# Patient Record
Sex: Female | Born: 1980 | Race: White | Hispanic: No | Marital: Married | Smoking: Never smoker
Health system: Southern US, Community
[De-identification: ages and names within clinical notes are randomized; demographics above are authoritative.]

## PROBLEM LIST (undated history)

## (undated) DIAGNOSIS — Z8759 Personal history of other complications of pregnancy, childbirth and the puerperium: Secondary | ICD-10-CM

## (undated) DIAGNOSIS — Z8619 Personal history of other infectious and parasitic diseases: Secondary | ICD-10-CM

## (undated) DIAGNOSIS — E7212 Methylenetetrahydrofolate reductase deficiency: Secondary | ICD-10-CM

## (undated) DIAGNOSIS — E039 Hypothyroidism, unspecified: Secondary | ICD-10-CM

## (undated) DIAGNOSIS — Z1589 Genetic susceptibility to other disease: Secondary | ICD-10-CM

## (undated) HISTORY — DX: Methylenetetrahydrofolate reductase deficiency: E72.12

## (undated) HISTORY — DX: Genetic susceptibility to other disease: Z15.89

## (undated) HISTORY — PX: TONSILLECTOMY: SUR1361

## (undated) HISTORY — DX: Hypothyroidism, unspecified: E03.9

## (undated) HISTORY — DX: Personal history of other complications of pregnancy, childbirth and the puerperium: Z87.59

## (undated) HISTORY — DX: Personal history of other infectious and parasitic diseases: Z86.19

---

## 2002-07-11 ENCOUNTER — Emergency Department (HOSPITAL_COMMUNITY): Admission: EM | Admit: 2002-07-11 | Discharge: 2002-07-11 | Payer: Self-pay | Admitting: Emergency Medicine

## 2013-03-17 ENCOUNTER — Inpatient Hospital Stay (HOSPITAL_COMMUNITY): Payer: BC Managed Care – PPO

## 2013-03-17 ENCOUNTER — Encounter (HOSPITAL_COMMUNITY): Payer: Self-pay

## 2013-03-17 ENCOUNTER — Inpatient Hospital Stay (HOSPITAL_COMMUNITY)
Admission: AD | Admit: 2013-03-17 | Discharge: 2013-03-17 | Disposition: A | Discharge status: Home / Self Care | Payer: BC Managed Care – PPO | Source: Ambulatory Visit | Attending: Obstetrics and Gynecology | Admitting: Obstetrics and Gynecology

## 2013-03-17 DIAGNOSIS — O00109 Unspecified tubal pregnancy without intrauterine pregnancy: Secondary | ICD-10-CM | POA: Diagnosis not present

## 2013-03-17 DIAGNOSIS — N9489 Other specified conditions associated with female genital organs and menstrual cycle: Secondary | ICD-10-CM | POA: Diagnosis not present

## 2013-03-17 DIAGNOSIS — N72 Inflammatory disease of cervix uteri: Secondary | ICD-10-CM | POA: Diagnosis not present

## 2013-03-17 DIAGNOSIS — R109 Unspecified abdominal pain: Secondary | ICD-10-CM | POA: Insufficient documentation

## 2013-03-17 DIAGNOSIS — N83209 Unspecified ovarian cyst, unspecified side: Secondary | ICD-10-CM | POA: Diagnosis not present

## 2013-03-17 LAB — COMPREHENSIVE METABOLIC PANEL
ALBUMIN: 4.1 g/dL (ref 3.5–5.2)
ALT: 14 U/L (ref 0–35)
AST: 18 U/L (ref 0–37)
Alkaline Phosphatase: 56 U/L (ref 39–117)
BUN: 7 mg/dL (ref 6–23)
CHLORIDE: 102 meq/L (ref 96–112)
CO2: 26 mEq/L (ref 19–32)
CREATININE: 0.74 mg/dL (ref 0.50–1.10)
Calcium: 9.5 mg/dL (ref 8.4–10.5)
GFR calc Af Amer: >90 mL/min (ref >90)
GFR calc non Af Amer: >90 mL/min (ref >90)
Glucose, Bld: 92 mg/dL (ref 70–99)
Potassium: 3.7 mEq/L (ref 3.7–5.3)
Sodium: 140 mEq/L (ref 137–147)
Total Bilirubin: 0.5 mg/dL (ref 0.3–1.2)
Total Protein: 6.7 g/dL (ref 6.0–8.3)

## 2013-03-17 LAB — CBC
HEMATOCRIT: 39 % (ref 36.0–46.0)
Hemoglobin: 13.2 g/dL (ref 12.0–15.0)
MCH: 29.2 pg (ref 26.0–34.0)
MCHC: 33.8 g/dL (ref 30.0–36.0)
MCV: 86.3 fL (ref 78.0–100.0)
Platelets: 231 10*3/uL (ref 150–400)
RBC: 4.52 MIL/uL (ref 3.87–5.11)
RDW: 13.1 % (ref 11.5–15.5)
WBC: 7.6 10*3/uL (ref 4.0–10.5)

## 2013-03-17 LAB — HCG, QUANTITATIVE, PREGNANCY: hCG, Beta Chain, Quant, S: 303 m[IU]/mL — ABNORMAL HIGH (ref <5)

## 2013-03-17 MED ORDER — METHOTREXATE INJECTION FOR WOMEN'S HOSPITAL
50.0000 mg/m2 | Freq: Once | INTRAMUSCULAR | Status: AC
  Administered 2013-03-17: 95 mg via INTRAMUSCULAR
  Filled 2013-03-17: qty 1.9

## 2013-03-17 NOTE — Discharge Instructions (Signed)
Ectopic Pregnancy °An ectopic pregnancy is when the fertilized egg attaches (implants) outside the uterus. Most ectopic pregnancies occur in the fallopian tube. Rarely do ectopic pregnancies occur on the ovary, intestine, pelvis, or cervix. In an ectopic pregnancy, the fertilized egg does not have the ability to develop into a normal, healthy baby.  °A ruptured ectopic pregnancy is one in which the fallopian tube gets torn or bursts and results in internal bleeding. Often there is intense abdominal pain, and sometimes, vaginal bleeding. Having an ectopic pregnancy can be life threatening. If left untreated, this dangerous condition can lead to a blood transfusion, abdominal surgery, or even death. °CAUSES  °Damage to the fallopian tubes is the suspected cause in most ectopic pregnancies.  °RISK FACTORS °Depending on your circumstances, the risk of having an ectopic pregnancy will vary. The level of risk can be divided into three categories. °High Risk °· You have gone through infertility treatment. °· You have had a previous ectopic pregnancy. °· You have had previous tubal surgery. °· You have had previous surgery to have the fallopian tubes tied (tubal ligation). °· You have tubal problems or diseases. °· You have been exposed to DES. DES is a medicine that was used until 1971 and had effects on babies whose mothers took the medicine. °· You become pregnant while using an intrauterine device (IUD) for birth control.  °Moderate Risk °· You have a history of infertility. °· You have a history of a sexually transmitted infection (STI). °· You have a history of pelvic inflammatory disease (PID). °· You have scarring from endometriosis. °· You have multiple sexual partners. °· You smoke.  °Low Risk °· You have had previous pelvic surgery. °· You use vaginal douching. °· You became sexually active before 33 years of age. °SIGNS AND SYMPTOMS  °An ectopic pregnancy should be suspected in anyone who has missed a period and  has abdominal pain or bleeding. °· You may experience normal pregnancy symptoms, such as: °· Nausea. °· Tiredness. °· Breast tenderness. °· Other symptoms may include: °· Pain with intercourse. °· Irregular vaginal bleeding or spotting. °· Cramping or pain on one side or in the lower abdomen. °· Fast heartbeat. °· Passing out while having a bowel movement. °· Symptoms of a ruptured ectopic pregnancy and internal bleeding may include: °· Sudden, severe pain in the abdomen and pelvis. °· Dizziness or fainting. °· Pain in the shoulder area. °DIAGNOSIS  °Tests that may be performed include: °· A pregnancy test. °· An ultrasound test. °· Testing the specific level of pregnancy hormone in the bloodstream. °· Taking a sample of uterus tissue (dilation and curettage, D&C). °· Surgery to perform a visual exam of the inside of the abdomen using a thin, lighted tube with a tiny camera on the end (laparoscope). °TREATMENT  °An injection of a medicine called methotrexate may be given. This medicine causes the pregnancy tissue to be absorbed. It is given if: °· The diagnosis is made early. °· The fallopian tube has not ruptured. °· You are considered to be a good candidate for the medicine. °Usually, pregnancy hormone blood levels are checked after methotrexate treatment. This is to be sure the medicine is effective. It may take 4 6 weeks for the pregnancy to be absorbed (though most pregnancies will be absorbed by 3 weeks). °Surgical treatment may be needed. A laparoscope may be used to remove the pregnancy tissue. If severe internal bleeding occurs, a cut (incision) may be made in the lower abdomen (laparotomy), and the   ectopic pregnancy is removed. This stops the bleeding. Part of the fallopian tube, or the whole tube, may be removed as well (salpingectomy). After surgery, pregnancy hormone tests may be done to be sure there is no pregnancy tissue left. You may receive an Rho(D) immune globulin shot if you are Rh negative and  the father is Rh positive, or if you do not know the Rh type of the father. This is to prevent problems with any future pregnancy. °SEEK IMMEDIATE MEDICAL CARE IF:  °You have any symptoms of an ectopic pregnancy. This is a medical emergency. °Document Released: 03/06/2004 Document Revised: 11/17/2012 Document Reviewed: 08/26/2012 °ExitCare® Patient Information ©2014 [employer]. ° °

## 2013-03-17 NOTE — MAU Provider Note (Signed)
History     CSN: [id number]  Arrival date and time: 03/17/13 1805   None     Chief Complaint  Patient presents with  . Abdominal Pain  . Vaginal Bleeding   HPI Comments: Pt is a G1P0 at approx 7wks, sent from office for further eval. Had a quant today that 563, on 1-23 it was 378, on 1-21 it was 718. Today here in MAU it was 303. Pt denies any significant pain, tho has some "tightness" and vague fullness on R side, denies any VB. Denies any CP, or SOB.    Abdominal Pain  Vaginal Bleeding Associated symptoms include abdominal pain.     History reviewed. No pertinent past medical history.  Past Surgical History  Procedure Laterality Date  . Tonsillectomy      Family History  Problem Relation Age of Onset  . Hypertension Mother     History  Substance Use Topics  . Smoking status: Never Smoker   . Smokeless tobacco: Never Used  . Alcohol Use: No    Allergies: No Known Allergies  Prescriptions prior to admission  Medication Sig Dispense Refill  . ampicillin (PRINCIPEN) 500 MG capsule Take 500 mg by mouth 2 (two) times daily. X 7 days. Started on 03/10/12.      Marland Kitchen HYDROcodone-acetaminophen (NORCO/VICODIN) 5-325 MG per tablet Take 1-2 tablets by mouth every 6 (six) hours as needed for moderate pain.      . Ibuprofen 200 MG CAPS Take 2-3 capsules by mouth 3 (three) times daily as needed (for pain.).        Review of Systems  Gastrointestinal: Positive for abdominal pain.  Genitourinary: Positive for vaginal bleeding.  All other systems reviewed and are negative.   Physical Exam   Blood pressure 130/63, pulse 85, temperature 98.6 F (37 C), temperature source Oral, height 5' 3.5" (1.613 m), weight 170 lb (77.111 kg), last menstrual period 01/26/2013, SpO2 100.00%.  Physical Exam  Nursing note and vitals reviewed. Constitutional: She is oriented to person, place, and time. She appears well-developed and well-nourished.  HENT:  Head: Normocephalic.  Eyes: Pupils  are equal, round, and reactive to light.  Neck: Normal range of motion.  Cardiovascular: Normal rate, regular rhythm and normal heart sounds.   Respiratory: Effort normal and breath sounds normal.  GI: Soft. Bowel sounds are normal. She exhibits no distension. There is no tenderness. There is no rebound and no guarding.  Genitourinary:  Deferred   Musculoskeletal: Normal range of motion.  Neurological: She is alert and oriented to person, place, and time. She has normal reflexes.  Skin: Skin is warm and dry.  Psychiatric: She has a normal mood and affect. Her behavior is normal.   US Ob Comp Less 14 Wks  03/17/2013   CLINICAL DATA:  Viability. Dropping beta HCG, currently 303. Questionable right ectopic seen on office sonography, per report.  EXAM: OBSTETRIC <14 WK [place] AND TRANSVAGINAL OB US  TECHNIQUE: Both transabdominal and transvaginal ultrasound examinations were performed for complete evaluation of the gestation as well as the maternal uterus, adnexal regions, and pelvic cul-de-sac. Transvaginal technique was performed to assess early pregnancy.  COMPARISON:  None.  FINDINGS: There is no normal intrauterine gestational sac. The uterus has a normal appearance. The endometrium is homogeneous thin at 4mm. Nabothian cysts are present.  The ovaries are relatively symmetric in size, 2.3 x 1 x 1.4 cm on the left and 3.4 x 2.3 x 3 cm on the right (size differential related to a  simple 3 cm intra-ovarian cyst).  In the right adnexa, there is a mildly heterogeneous, echogenic extra ovarian mass which measures approximately 2 cm. The mass does not have a definitive appearance of gestational sac. There is trace free fluid which appears simple.  Critical Value/emergent results were called by telephone at the time of interpretation on 03/17/2013 at 9:28 PM to Dr. Sanda KleinSHELLEY Elden Brucato , who verbally acknowledged these results.  IMPRESSION: 1. No visible intrauterine gestation or focal endometrial thickening. There is a 2  cm extra-ovarian adnexal mass on the right, significantly increasing the likelihood of ectopic pregnancy in this location. 2. Trace, simple free fluid.   Electronically Signed   By: Tiburcio PeaJonathan  Watts M.D.   On: 03/17/2013 21:28   [place] Ob Transvaginal  03/17/2013   CLINICAL DATA:  Viability. Dropping beta HCG, currently 303. Questionable right ectopic seen on office sonography, per report.  EXAM: OBSTETRIC <14 WK US AND TRANSVAGINAL OB US  TECHNIQUE: Both transabdominal and transvaginal ultrasound examinations were performed for complete evaluation of the gestation as well as the maternal uterus, adnexal regions, and pelvic cul-de-sac. Transvaginal technique was performed to assess early pregnancy.  COMPARISON:  None.  FINDINGS: There is no normal intrauterine gestational sac. The uterus has a normal appearance. The endometrium is homogeneous thin at 4mm. Nabothian cysts are present.  The ovaries are relatively symmetric in size, 2.3 x 1 x 1.4 cm on the left and 3.4 x 2.3 x 3 cm on the right (size differential related to a simple 3 cm intra-ovarian cyst).  In the right adnexa, there is a mildly heterogeneous, echogenic extra ovarian mass which measures approximately 2 cm. The mass does not have a definitive appearance of gestational sac. There is trace free fluid which appears simple.  Critical Value/emergent results were called by telephone at the time of interpretation on 03/17/2013 at 9:28 PM to Dr. Sanda KleinSHELLEY Kaysia Willard , who verbally acknowledged these results.  IMPRESSION: 1. No visible intrauterine gestation or focal endometrial thickening. There is a 2 cm extra-ovarian adnexal mass on the right, significantly increasing the likelihood of ectopic pregnancy in this location. 2. Trace, simple free fluid.   Electronically Signed   By: Tiburcio PeaJonathan  Watts M.D.   On: 03/17/2013 21:28   Results for orders placed during the hospital encounter of 03/17/13 (from the past 24 hour(s))  HCG, QUANTITATIVE, PREGNANCY     Status:  Abnormal   Collection Time    03/17/13  7:05 PM      Result Value Range   hCG, Beta Chain, Quant, S 303 (*) <5 mIU/mL  CBC     Status: None   Collection Time    03/17/13  7:05 PM      Result Value Range   WBC 7.6  4.0 - 10.5 K/uL   RBC 4.52  3.87 - 5.11 MIL/uL   Hemoglobin 13.2  12.0 - 15.0 g/dL   HCT 16.139.0  09.636.0 - 04.546.0 %   MCV 86.3  78.0 - 100.0 fL   MCH 29.2  26.0 - 34.0 pg   MCHC 33.8  30.0 - 36.0 g/dL   RDW 40.913.1  81.111.5 - 91.415.5 %   Platelets 231  150 - 400 K/uL  COMPREHENSIVE METABOLIC PANEL     Status: None   Collection Time    03/17/13  7:05 PM      Result Value Range   Sodium 140  137 - 147 mEq/L   Potassium 3.7  3.7 - 5.3 mEq/L   Chloride 102  96 - 112 mEq/L   CO2 26  19 - 32 mEq/L   Glucose, Bld 92  70 - 99 mg/dL   BUN 7  6 - 23 mg/dL   Creatinine, Ser 1.61  0.50 - 1.10 mg/dL   Calcium 9.5  8.4 - 09.6 mg/dL   Total Protein 6.7  6.0 - 8.3 g/dL   Albumin 4.1  3.5 - 5.2 g/dL   AST 18  0 - 37 U/L   ALT 14  0 - 35 U/L   Alkaline Phosphatase 56  39 - 117 U/L   Total Bilirubin 0.5  0.3 - 1.2 mg/dL   GFR calc non Af Amer >90  >90 mL/min   GFR calc Af Amer >90  >90 mL/min     MAU Course  Procedures  Beta Quant CBC CMET Methotrexate    Assessment and Plan  US shows R adnexal mass suspicous for ectopic pregnancy After discussion w pt and Dr Su Hilt, who said to offer pt methotrexate Pt was offered options of expectant mgmnt, methotrexate or surgery and she's decided on methotrexate Verified BLood type that was drawn at office which is A POS.  LFT's are normal and CBC was normal w Hgb 13.2 Rv'd R/B and side effects of methotrexate, also rv'd ectopic precautions  Pt is to return to [facility]U Monday for [facility] and the following Thursday 2-12 She verbalized understanding  D/w Dr Su Hilt,  Gevena Barre, CNM     Kordae Buonocore M 03/17/2013, 8:13 PM

## 2013-03-17 NOTE — MAU Note (Signed)
Patient states she has been followed in the office for the past 2 weeks. States her BHCG levels have fallen and she was told she had had a miscarriage. States she has continued to have pain and bleeding and was seen in the office yesterday and had another BHCG and ultrasound done.  Was called today and told her BHCG level had increased and a 3cm area on the right side was seen. Sent to MAU for further evaluation.

## 2013-03-20 ENCOUNTER — Inpatient Hospital Stay (HOSPITAL_COMMUNITY)
Admission: AD | Admit: 2013-03-20 | Discharge: 2013-03-20 | Disposition: A | Discharge status: Home / Self Care | Payer: BC Managed Care – PPO | Source: Ambulatory Visit | Attending: Obstetrics and Gynecology | Admitting: Obstetrics and Gynecology

## 2013-03-20 DIAGNOSIS — O00109 Unspecified tubal pregnancy without intrauterine pregnancy: Secondary | ICD-10-CM | POA: Insufficient documentation

## 2013-03-20 LAB — COMPREHENSIVE METABOLIC PANEL
ALT: 24 U/L (ref 0–35)
AST: 30 U/L (ref 0–37)
Albumin: 3.8 g/dL (ref 3.5–5.2)
Alkaline Phosphatase: 56 U/L (ref 39–117)
BILIRUBIN TOTAL: 0.9 mg/dL (ref 0.3–1.2)
BUN: 17 mg/dL (ref 6–23)
CHLORIDE: 102 meq/L (ref 96–112)
CO2: 27 meq/L (ref 19–32)
CREATININE: 0.77 mg/dL (ref 0.50–1.10)
Calcium: 9.4 mg/dL (ref 8.4–10.5)
GFR calc Af Amer: >90 mL/min (ref >90)
GFR calc non Af Amer: >90 mL/min (ref >90)
GLUCOSE: 115 mg/dL — AB (ref 70–99)
Potassium: 4.1 mEq/L (ref 3.7–5.3)
Sodium: 140 mEq/L (ref 137–147)
Total Protein: 6.9 g/dL (ref 6.0–8.3)

## 2013-03-20 LAB — CBC
HEMATOCRIT: 39.5 % (ref 36.0–46.0)
Hemoglobin: 13.2 g/dL (ref 12.0–15.0)
MCH: 28.9 pg (ref 26.0–34.0)
MCHC: 33.4 g/dL (ref 30.0–36.0)
MCV: 86.6 fL (ref 78.0–100.0)
Platelets: 218 10*3/uL (ref 150–400)
RBC: 4.56 MIL/uL (ref 3.87–5.11)
RDW: 13 % (ref 11.5–15.5)
WBC: 8 10*3/uL (ref 4.0–10.5)

## 2013-03-20 LAB — HCG, QUANTITATIVE, PREGNANCY: hCG, Beta Chain, Quant, S: 132 m[IU]/mL — ABNORMAL HIGH (ref <5)

## 2013-03-20 NOTE — MAU Note (Signed)
Reviewed labs with pt and she verbalizes understanding to follow up on Tuesday for repeat quant.

## 2013-03-20 NOTE — MAU Note (Signed)
Pt presents for repeat labs, methotrexate was given on Thursday.

## 2013-03-20 NOTE — Progress Notes (Signed)
Discussed labs with S Lillard CNM and she states to discharge pt and have her follow up as planned

## 2013-03-23 ENCOUNTER — Inpatient Hospital Stay (HOSPITAL_COMMUNITY)
Admission: AD | Admit: 2013-03-23 | Discharge: 2013-03-23 | Disposition: A | Discharge status: Home / Self Care | Payer: BC Managed Care – PPO | Source: Ambulatory Visit | Attending: Obstetrics and Gynecology | Admitting: Obstetrics and Gynecology

## 2013-03-23 ENCOUNTER — Encounter (HOSPITAL_COMMUNITY): Payer: Self-pay

## 2013-03-23 DIAGNOSIS — O00109 Unspecified tubal pregnancy without intrauterine pregnancy: Secondary | ICD-10-CM | POA: Insufficient documentation

## 2013-03-23 LAB — HCG, QUANTITATIVE, PREGNANCY: HCG, BETA CHAIN, QUANT, S: 56 m[IU]/mL — AB (ref <5)

## 2013-03-23 NOTE — MAU Note (Signed)
Patient to MAU for repeat BHCG day 7 S/P MTX. Patient denies pain or bleeding.

## 2013-03-23 NOTE — MAU Note (Signed)
Dr. Normand Sloopillard notified of patient lab results. Patient may be instructed to follow up in the office in one week for a BHCG. Instructions given to the patient and patient verbalized understanding.

## 2013-12-12 ENCOUNTER — Encounter (HOSPITAL_COMMUNITY): Payer: Self-pay

## 2014-01-09 ENCOUNTER — Other Ambulatory Visit (HOSPITAL_COMMUNITY): Payer: Self-pay | Admitting: Obstetrics and Gynecology

## 2014-01-09 DIAGNOSIS — N979 Female infertility, unspecified: Secondary | ICD-10-CM

## 2014-01-13 ENCOUNTER — Ambulatory Visit (HOSPITAL_COMMUNITY)
Admission: RE | Admit: 2014-01-13 | Discharge: 2014-01-13 | Disposition: A | Discharge status: Home / Self Care | Payer: BC Managed Care – PPO | Source: Ambulatory Visit | Attending: Obstetrics and Gynecology | Admitting: Obstetrics and Gynecology

## 2014-01-13 DIAGNOSIS — N979 Female infertility, unspecified: Secondary | ICD-10-CM | POA: Insufficient documentation

## 2014-01-13 DIAGNOSIS — N7011 Chronic salpingitis: Secondary | ICD-10-CM | POA: Diagnosis not present

## 2014-01-13 MED ORDER — IOHEXOL 300 MG/ML  SOLN
20.0000 mL | Freq: Once | INTRAMUSCULAR | Status: AC | PRN
  Administered 2014-01-13: 20 mL

## 2014-05-04 LAB — OB RESULTS CONSOLE ANTIBODY SCREEN: Antibody Screen: NEGATIVE

## 2014-05-04 LAB — OB RESULTS CONSOLE HIV ANTIBODY (ROUTINE TESTING): HIV: NONREACTIVE

## 2014-05-04 LAB — OB RESULTS CONSOLE ABO/RH: RH Type: POSITIVE

## 2014-05-04 LAB — OB RESULTS CONSOLE HEPATITIS B SURFACE ANTIGEN: HEP B S AG: NEGATIVE

## 2014-05-04 LAB — OB RESULTS CONSOLE GC/CHLAMYDIA
Chlamydia: NEGATIVE
Gonorrhea: NEGATIVE

## 2014-05-04 LAB — OB RESULTS CONSOLE RUBELLA ANTIBODY, IGM: Rubella: IMMUNE

## 2014-05-04 LAB — OB RESULTS CONSOLE GBS: GBS: POSITIVE

## 2014-05-04 LAB — OB RESULTS CONSOLE RPR: RPR: NONREACTIVE

## 2014-09-27 ENCOUNTER — Inpatient Hospital Stay (HOSPITAL_COMMUNITY): Admission: AD | Admit: 2014-09-27 | Payer: Self-pay | Source: Ambulatory Visit | Admitting: Obstetrics and Gynecology

## 2014-11-07 IMAGING — US US OB TRANSVAGINAL
1 series · 13 of 28 positions shown · non-contrast
Comparison: None.

CLINICAL DATA: Viability. Dropping beta HCG, currently 303.
Questionable right ectopic seen on office sonography, per report.

EXAM:
OBSTETRIC <14 WK US AND TRANSVAGINAL OB US
TECHNIQUE: Both transabdominal and transvaginal ultrasound examinations were
performed for complete evaluation of the gestation as well as the
maternal uterus, adnexal regions, and pelvic cul-de-sac.
Transvaginal technique was performed to assess early pregnancy.

[Series 1: us ob comp less 14 wks · 35 acquisitions, 13 frames shown]
[im 2/35]
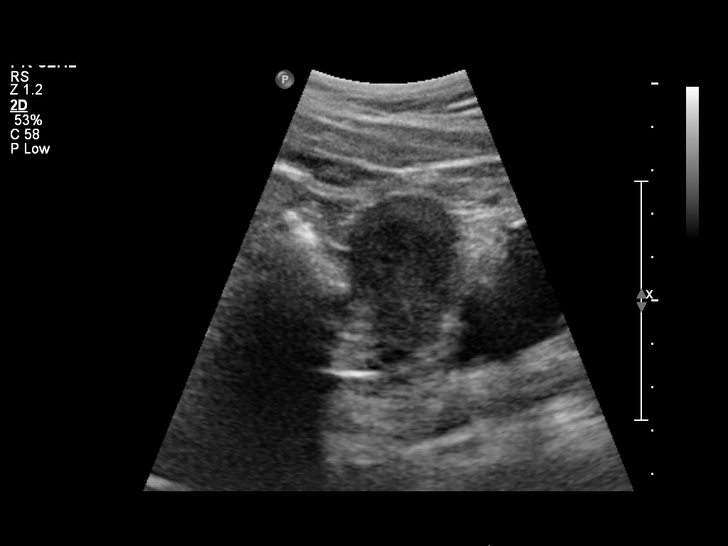
[im 4/35]
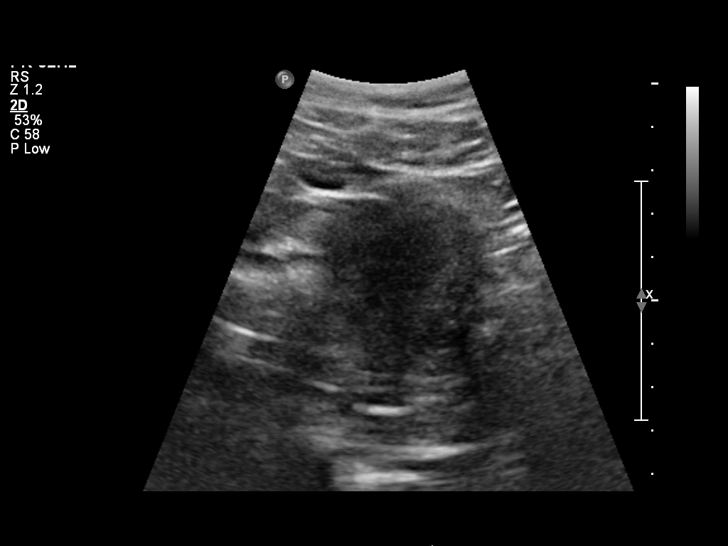
[im 7/35]
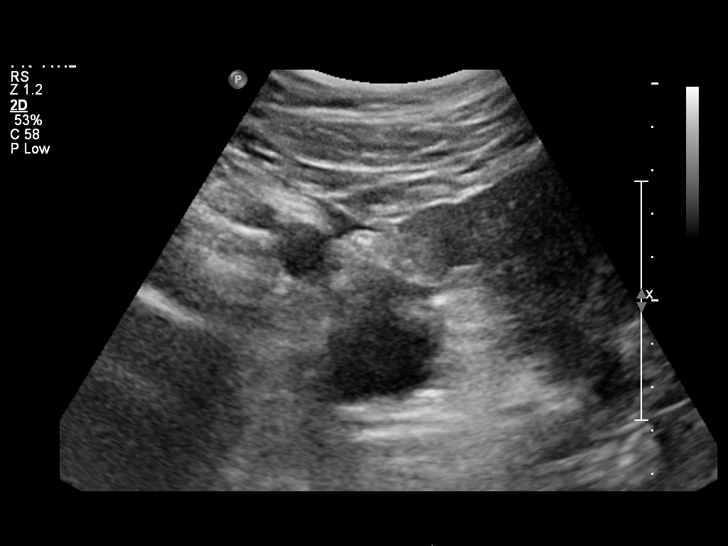
[im 9/35]
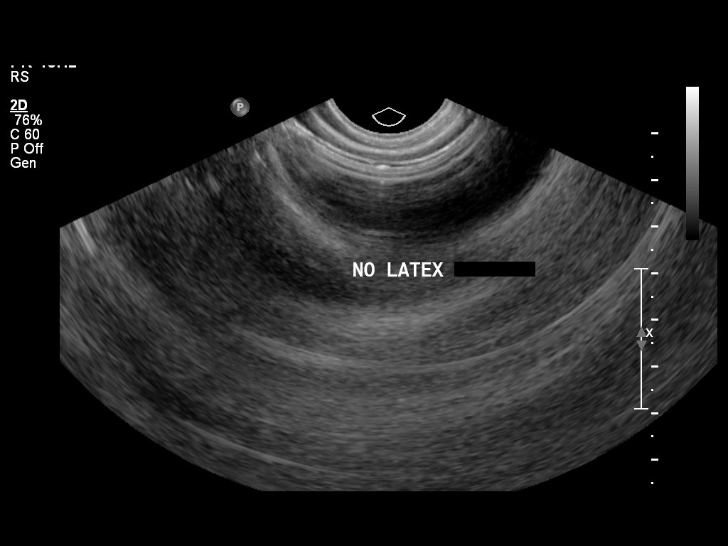
[im 12/35]
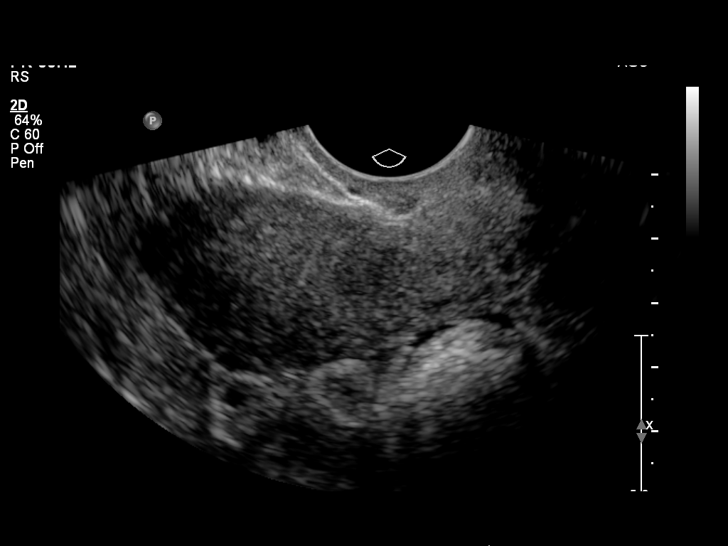
[im 14/35]
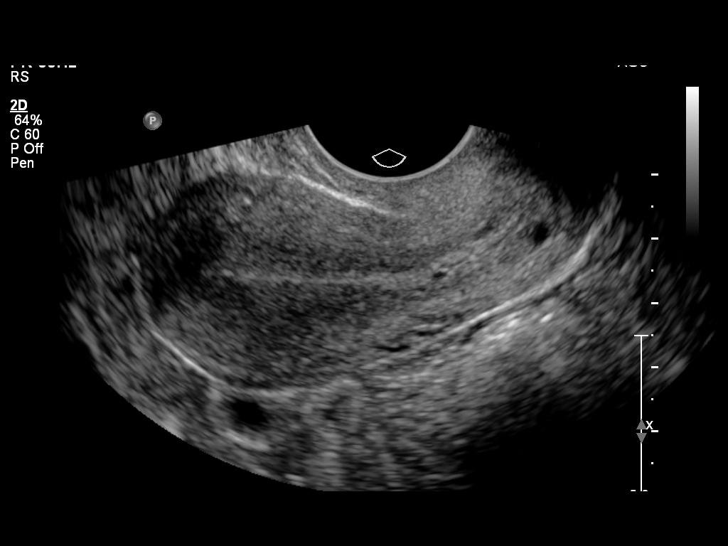
[im 18/35]
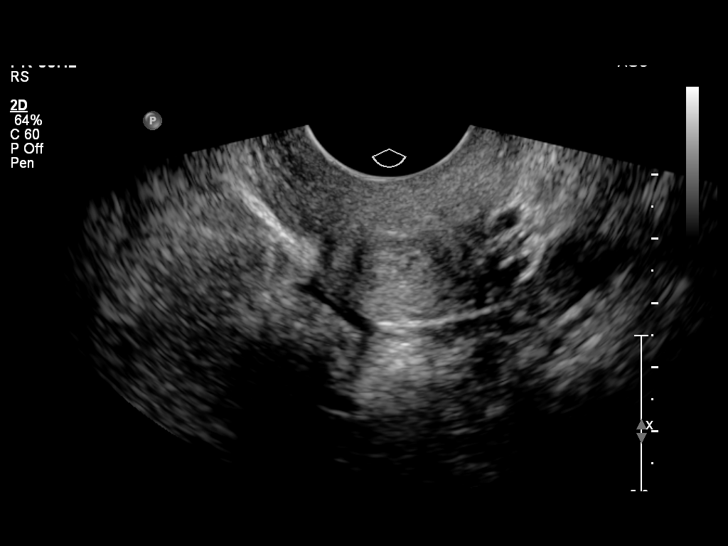
[im 21/35]
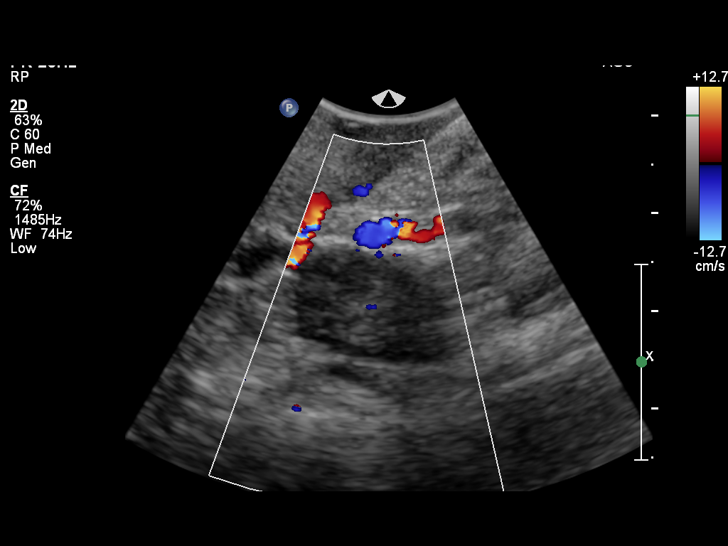
[im 23/35]
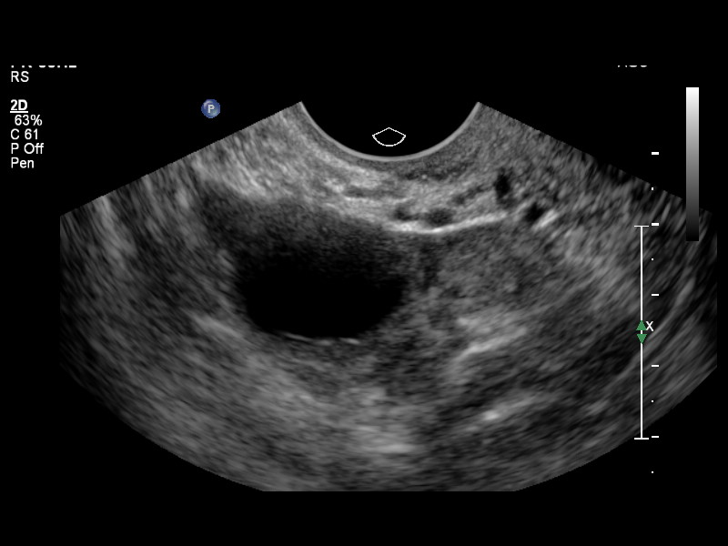
[im 26/35]
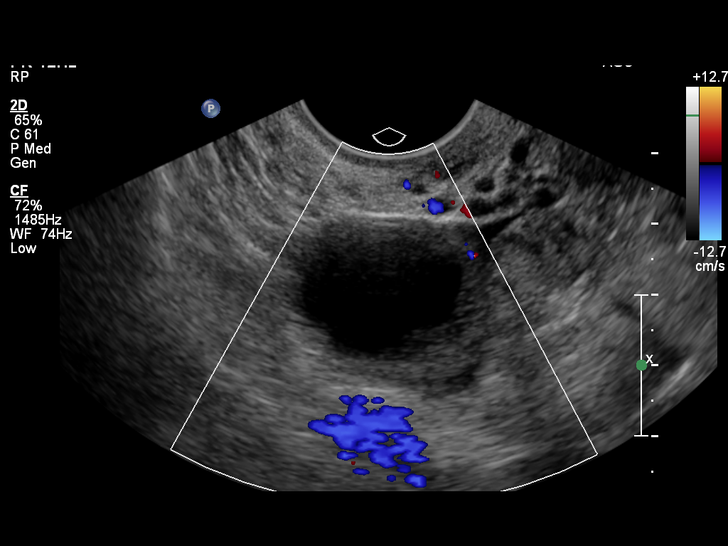
[im 28/35]
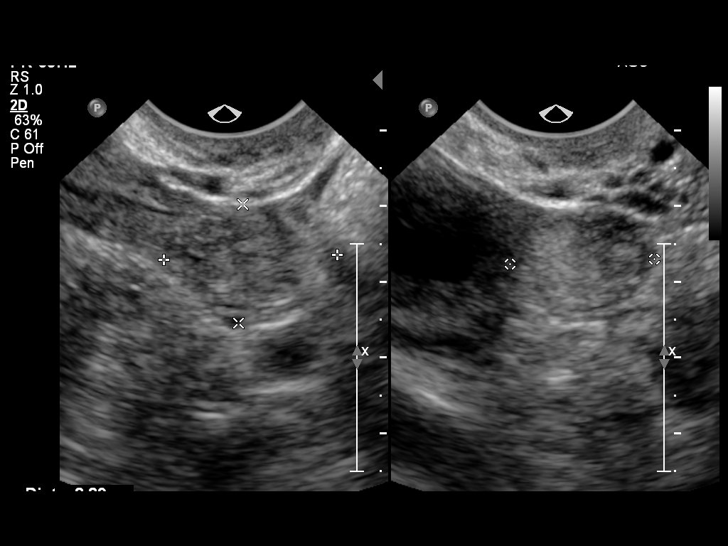
[im 31/35]
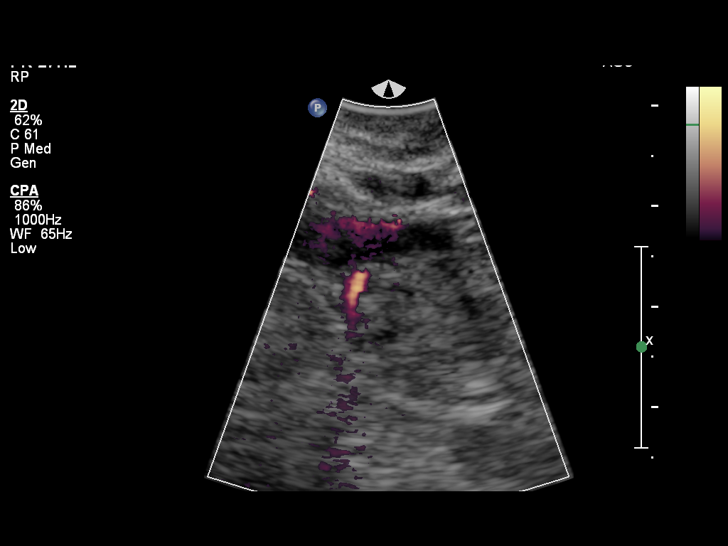
[im 33/35]
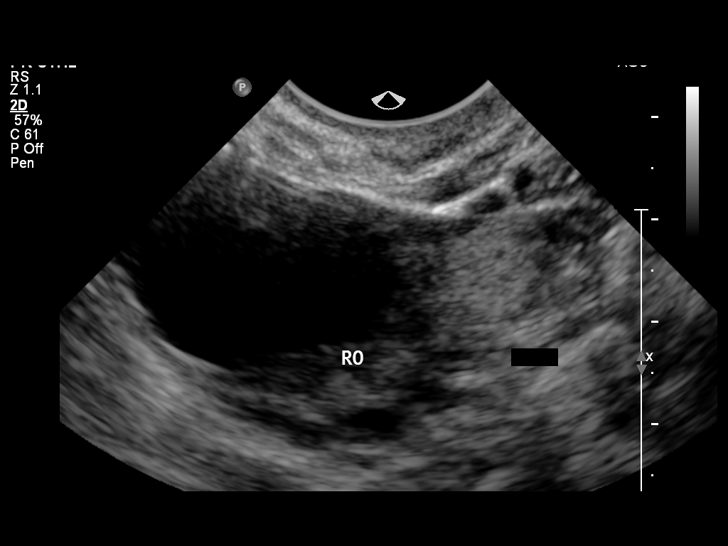

[13 of 28 positions shown; findings below may reference images not displayed]

FINDINGS: There is no normal intrauterine gestational sac. The uterus has a
normal appearance. The endometrium is homogeneous thin at 4mm.
Nabothian cysts are present.

The ovaries are relatively symmetric in size, 2.3 x 1 x 1.4 cm on
the left and 3.4 x 2.3 x 3 cm on the right (size differential
related to a simple 3 cm intra-ovarian cyst).

In the right adnexa, there is a mildly heterogeneous, echogenic
extra ovarian mass which measures approximately 2 cm. The mass does
not have a definitive appearance of gestational sac. There is trace
free fluid which appears simple.

Critical Value/emergent results were called by telephone at the time
of interpretation on 03/17/2013 at [DATE] to Dr. EBADAT TIGER ,
who verbally acknowledged these results.
IMPRESSION: 1. No visible intrauterine gestation or focal endometrial
thickening. There is a 2 cm extra-ovarian adnexal mass on the right,
significantly increasing the likelihood of ectopic pregnancy in this
location.
2. Trace, simple free fluid.

## 2014-12-04 ENCOUNTER — Other Ambulatory Visit: Payer: Self-pay | Admitting: Obstetrics and Gynecology

## 2014-12-06 ENCOUNTER — Encounter (HOSPITAL_COMMUNITY): Payer: Self-pay | Admitting: *Deleted

## 2014-12-06 ENCOUNTER — Telehealth (HOSPITAL_COMMUNITY): Payer: Self-pay | Admitting: *Deleted

## 2014-12-06 NOTE — Telephone Encounter (Signed)
Preadmission screen  

## 2014-12-08 ENCOUNTER — Inpatient Hospital Stay (HOSPITAL_COMMUNITY)
Admission: AD | Admit: 2014-12-08 | Discharge: 2014-12-11 | DRG: 765 | Disposition: A | Discharge status: Home / Self Care | Payer: BLUE CROSS/BLUE SHIELD | Source: Ambulatory Visit | Attending: Obstetrics and Gynecology | Admitting: Obstetrics and Gynecology

## 2014-12-08 ENCOUNTER — Encounter (HOSPITAL_COMMUNITY)
Admission: AD | Disposition: A | Discharge status: Home / Self Care | Payer: Self-pay | Source: Ambulatory Visit | Attending: Obstetrics and Gynecology

## 2014-12-08 ENCOUNTER — Encounter (HOSPITAL_COMMUNITY): Payer: Self-pay | Admitting: *Deleted

## 2014-12-08 ENCOUNTER — Inpatient Hospital Stay (HOSPITAL_COMMUNITY): Payer: BLUE CROSS/BLUE SHIELD | Admitting: Anesthesiology

## 2014-12-08 DIAGNOSIS — O9962 Diseases of the digestive system complicating childbirth: Secondary | ICD-10-CM | POA: Diagnosis present

## 2014-12-08 DIAGNOSIS — Z3A40 40 weeks gestation of pregnancy: Secondary | ICD-10-CM | POA: Diagnosis not present

## 2014-12-08 DIAGNOSIS — O99214 Obesity complicating childbirth: Secondary | ICD-10-CM | POA: Diagnosis present

## 2014-12-08 DIAGNOSIS — E039 Hypothyroidism, unspecified: Secondary | ICD-10-CM | POA: Diagnosis present

## 2014-12-08 DIAGNOSIS — Z8249 Family history of ischemic heart disease and other diseases of the circulatory system: Secondary | ICD-10-CM

## 2014-12-08 DIAGNOSIS — O99824 Streptococcus B carrier state complicating childbirth: Secondary | ICD-10-CM | POA: Diagnosis present

## 2014-12-08 DIAGNOSIS — IMO0001 Reserved for inherently not codable concepts without codable children: Secondary | ICD-10-CM

## 2014-12-08 DIAGNOSIS — O403XX0 Polyhydramnios, third trimester, not applicable or unspecified: Secondary | ICD-10-CM | POA: Diagnosis present

## 2014-12-08 DIAGNOSIS — O99284 Endocrine, nutritional and metabolic diseases complicating childbirth: Secondary | ICD-10-CM | POA: Diagnosis present

## 2014-12-08 DIAGNOSIS — O411230 Chorioamnionitis, third trimester, not applicable or unspecified: Secondary | ICD-10-CM | POA: Diagnosis present

## 2014-12-08 DIAGNOSIS — K219 Gastro-esophageal reflux disease without esophagitis: Secondary | ICD-10-CM | POA: Diagnosis present

## 2014-12-08 DIAGNOSIS — Z6841 Body Mass Index (BMI) 40.0 and over, adult: Secondary | ICD-10-CM

## 2014-12-08 DIAGNOSIS — Z833 Family history of diabetes mellitus: Secondary | ICD-10-CM

## 2014-12-08 LAB — TYPE AND SCREEN
ABO/RH(D): A POS
Antibody Screen: NEGATIVE

## 2014-12-08 LAB — CBC
HCT: 40.2 % (ref 36.0–46.0)
Hemoglobin: 13.7 g/dL (ref 12.0–15.0)
MCH: 30.1 pg (ref 26.0–34.0)
MCHC: 34.1 g/dL (ref 30.0–36.0)
MCV: 88.4 fL (ref 78.0–100.0)
PLATELETS: 160 10*3/uL (ref 150–400)
RBC: 4.55 MIL/uL (ref 3.87–5.11)
RDW: 15.9 % — ABNORMAL HIGH (ref 11.5–15.5)
WBC: 15.6 10*3/uL — ABNORMAL HIGH (ref 4.0–10.5)

## 2014-12-08 LAB — RPR: RPR: NONREACTIVE

## 2014-12-08 LAB — ABO/RH: ABO/RH(D): A POS

## 2014-12-08 SURGERY — Surgical Case
Anesthesia: Epidural | Site: Abdomen

## 2014-12-08 MED ORDER — FENTANYL CITRATE (PF) 100 MCG/2ML IJ SOLN: 25.0000 ug | INTRAMUSCULAR | Status: DC | PRN

## 2014-12-08 MED ORDER — SODIUM BICARBONATE 8.4 % IV SOLN
INTRAVENOUS | Status: DC | PRN
  Administered 2014-12-08 (×2): 5 mL via EPIDURAL

## 2014-12-08 MED ORDER — OXYTOCIN BOLUS FROM INFUSION: 500.0000 mL | INTRAVENOUS | Status: DC

## 2014-12-08 MED ORDER — SCOPOLAMINE 1 MG/3DAYS TD PT72
MEDICATED_PATCH | TRANSDERMAL | Status: DC | PRN
  Administered 2014-12-08: 1 via TRANSDERMAL

## 2014-12-08 MED ORDER — FENTANYL 2.5 MCG/ML BUPIVACAINE 1/10 % EPIDURAL INFUSION (WH - ANES)
14.0000 mL/h | INTRAMUSCULAR | Status: DC | PRN
  Administered 2014-12-08 (×2): 14 mL/h via EPIDURAL
  Filled 2014-12-08 (×2): qty 125

## 2014-12-08 MED ORDER — KETOROLAC TROMETHAMINE 30 MG/ML IJ SOLN
INTRAMUSCULAR | Status: AC
  Administered 2014-12-08: 30 mg via INTRAMUSCULAR
  Filled 2014-12-08: qty 1

## 2014-12-08 MED ORDER — OXYTOCIN 10 UNIT/ML IJ SOLN
40.0000 [IU] | INTRAVENOUS | Status: DC | PRN
  Administered 2014-12-08: 40 [IU] via INTRAVENOUS

## 2014-12-08 MED ORDER — BUPIVACAINE LIPOSOME 1.3 % IJ SUSP
INTRAMUSCULAR | Status: DC | PRN
  Administered 2014-12-08: 20 mL

## 2014-12-08 MED ORDER — LIDOCAINE-EPINEPHRINE (PF) 2 %-1:200000 IJ SOLN
INTRAMUSCULAR | Status: AC
  Filled 2014-12-08: qty 20

## 2014-12-08 MED ORDER — ONDANSETRON HCL 4 MG/2ML IJ SOLN
4.0000 mg | Freq: Four times a day (QID) | INTRAMUSCULAR | Status: DC | PRN
  Administered 2014-12-08: 4 mg via INTRAVENOUS
  Filled 2014-12-08: qty 2

## 2014-12-08 MED ORDER — OXYCODONE-ACETAMINOPHEN 5-325 MG PO TABS: 1.0000 | ORAL_TABLET | ORAL | Status: DC | PRN

## 2014-12-08 MED ORDER — OXYCODONE-ACETAMINOPHEN 5-325 MG PO TABS: 2.0000 | ORAL_TABLET | ORAL | Status: DC | PRN

## 2014-12-08 MED ORDER — ACETAMINOPHEN 325 MG PO TABS
650.0000 mg | ORAL_TABLET | ORAL | Status: DC | PRN
  Administered 2014-12-08: 650 mg via ORAL
  Filled 2014-12-08: qty 2

## 2014-12-08 MED ORDER — ONDANSETRON HCL 4 MG/2ML IJ SOLN
INTRAMUSCULAR | Status: AC
  Filled 2014-12-08: qty 2

## 2014-12-08 MED ORDER — SODIUM BICARBONATE 8.4 % IV SOLN
INTRAVENOUS | Status: AC
  Filled 2014-12-08: qty 50

## 2014-12-08 MED ORDER — MORPHINE SULFATE (PF) 0.5 MG/ML IJ SOLN
INTRAMUSCULAR | Status: AC
  Filled 2014-12-08: qty 100

## 2014-12-08 MED ORDER — LACTATED RINGERS IV SOLN: INTRAVENOUS | Status: DC

## 2014-12-08 MED ORDER — LACTATED RINGERS IV SOLN
500.0000 mL | INTRAVENOUS | Status: DC | PRN
  Administered 2014-12-08 (×3): 500 mL via INTRAVENOUS

## 2014-12-08 MED ORDER — PENICILLIN G POTASSIUM 5000000 UNITS IJ SOLR
5.0000 10*6.[IU] | Freq: Once | INTRAVENOUS | Status: AC
  Administered 2014-12-08: 5 10*6.[IU] via INTRAVENOUS
  Filled 2014-12-08: qty 5

## 2014-12-08 MED ORDER — OXYTOCIN 40 UNITS IN LACTATED RINGERS INFUSION - SIMPLE MED: 1.0000 m[IU]/min | INTRAVENOUS | Status: DC

## 2014-12-08 MED ORDER — BUPIVACAINE LIPOSOME 1.3 % IJ SUSP
20.0000 mL | Freq: Once | INTRAMUSCULAR | Status: DC
  Filled 2014-12-08: qty 20

## 2014-12-08 MED ORDER — LACTATED RINGERS IV SOLN
INTRAVENOUS | Status: DC | PRN
  Administered 2014-12-08 (×3): via INTRAVENOUS

## 2014-12-08 MED ORDER — SODIUM CHLORIDE 0.9 % IJ SOLN
INTRAMUSCULAR | Status: AC
  Filled 2014-12-08: qty 20

## 2014-12-08 MED ORDER — GLYCOPYRROLATE 0.2 MG/ML IJ SOLN
INTRAMUSCULAR | Status: DC | PRN
  Administered 2014-12-08: 0.1 mg via INTRAVENOUS

## 2014-12-08 MED ORDER — OXYTOCIN 40 UNITS IN LACTATED RINGERS INFUSION - SIMPLE MED
1.0000 m[IU]/min | INTRAVENOUS | Status: DC
  Administered 2014-12-08: 2 m[IU]/min via INTRAVENOUS

## 2014-12-08 MED ORDER — BUPIVACAINE HCL (PF) 0.25 % IJ SOLN
INTRAMUSCULAR | Status: AC
  Filled 2014-12-08: qty 10

## 2014-12-08 MED ORDER — DEXTROSE 5 % IV SOLN
2.0000 g | INTRAVENOUS | Status: DC | PRN
  Administered 2014-12-08: 2 g via INTRAVENOUS

## 2014-12-08 MED ORDER — DEXTROSE 5 % IV SOLN
2.0000 g | INTRAVENOUS | Status: DC
  Filled 2014-12-08: qty 2

## 2014-12-08 MED ORDER — GLYCOPYRROLATE 0.2 MG/ML IJ SOLN
INTRAMUSCULAR | Status: AC
  Filled 2014-12-08: qty 1

## 2014-12-08 MED ORDER — KETOROLAC TROMETHAMINE 30 MG/ML IJ SOLN
30.0000 mg | Freq: Four times a day (QID) | INTRAMUSCULAR | Status: DC | PRN
  Administered 2014-12-08: 30 mg via INTRAMUSCULAR

## 2014-12-08 MED ORDER — PENICILLIN G POTASSIUM 5000000 UNITS IJ SOLR
2.5000 10*6.[IU] | INTRAVENOUS | Status: DC
  Administered 2014-12-08 (×3): 2.5 10*6.[IU] via INTRAVENOUS
  Filled 2014-12-08 (×5): qty 2.5

## 2014-12-08 MED ORDER — LACTATED RINGERS IV SOLN
INTRAVENOUS | Status: DC | PRN
  Administered 2014-12-08 (×2): via INTRAVENOUS

## 2014-12-08 MED ORDER — CITRIC ACID-SODIUM CITRATE 334-500 MG/5ML PO SOLN
30.0000 mL | ORAL | Status: DC | PRN
  Administered 2014-12-08: 30 mL via ORAL
  Filled 2014-12-08: qty 15

## 2014-12-08 MED ORDER — MORPHINE SULFATE (PF) 0.5 MG/ML IJ SOLN
INTRAMUSCULAR | Status: DC | PRN
  Administered 2014-12-08: 4 mg via EPIDURAL
  Administered 2014-12-08: 1 mg via INTRAVENOUS

## 2014-12-08 MED ORDER — MEPERIDINE HCL 25 MG/ML IJ SOLN: 6.2500 mg | INTRAMUSCULAR | Status: DC | PRN

## 2014-12-08 MED ORDER — LACTATED RINGERS IV SOLN
INTRAVENOUS | Status: DC
  Administered 2014-12-08 (×2): via INTRAVENOUS

## 2014-12-08 MED ORDER — DIPHENHYDRAMINE HCL 50 MG/ML IJ SOLN: 12.5000 mg | INTRAMUSCULAR | Status: DC | PRN

## 2014-12-08 MED ORDER — KETOROLAC TROMETHAMINE 30 MG/ML IJ SOLN: 30.0000 mg | Freq: Four times a day (QID) | INTRAMUSCULAR | Status: DC | PRN

## 2014-12-08 MED ORDER — LIDOCAINE HCL (PF) 1 % IJ SOLN
INTRAMUSCULAR | Status: DC | PRN
  Administered 2014-12-08 (×2): 4 mL via EPIDURAL

## 2014-12-08 MED ORDER — FLEET ENEMA 7-19 GM/118ML RE ENEM: 1.0000 | ENEMA | RECTAL | Status: DC | PRN

## 2014-12-08 MED ORDER — TERBUTALINE SULFATE 1 MG/ML IJ SOLN
0.2500 mg | Freq: Once | INTRAMUSCULAR | Status: DC | PRN
Start: 2014-12-08 — End: 2014-12-09

## 2014-12-08 MED ORDER — OXYTOCIN 10 UNIT/ML IJ SOLN
INTRAMUSCULAR | Status: AC
  Filled 2014-12-08: qty 4

## 2014-12-08 MED ORDER — PHENYLEPHRINE 40 MCG/ML (10ML) SYRINGE FOR IV PUSH (FOR BLOOD PRESSURE SUPPORT)
80.0000 ug | PREFILLED_SYRINGE | INTRAVENOUS | Status: DC | PRN
  Filled 2014-12-08: qty 20

## 2014-12-08 MED ORDER — ONDANSETRON HCL 4 MG/2ML IJ SOLN
INTRAMUSCULAR | Status: DC | PRN
  Administered 2014-12-08: 4 mg via INTRAVENOUS

## 2014-12-08 MED ORDER — OXYTOCIN 40 UNITS IN LACTATED RINGERS INFUSION - SIMPLE MED
62.5000 mL/h | INTRAVENOUS | Status: DC
  Filled 2014-12-08: qty 1000

## 2014-12-08 MED ORDER — LIDOCAINE HCL (PF) 1 % IJ SOLN
30.0000 mL | INTRAMUSCULAR | Status: DC | PRN
  Filled 2014-12-08: qty 30

## 2014-12-08 MED ORDER — SCOPOLAMINE 1 MG/3DAYS TD PT72
MEDICATED_PATCH | TRANSDERMAL | Status: AC
  Filled 2014-12-08: qty 1

## 2014-12-08 MED ORDER — EPHEDRINE 5 MG/ML INJ: 10.0000 mg | INTRAVENOUS | Status: DC | PRN

## 2014-12-08 MED ORDER — BUPIVACAINE HCL (PF) 0.25 % IJ SOLN
INTRAMUSCULAR | Status: DC | PRN
Start: 2014-12-08 — End: 2014-12-08
  Administered 2014-12-08: 10 mL

## 2014-12-08 SURGICAL SUPPLY — 35 items
CLAMP CORD UMBIL (MISCELLANEOUS) IMPLANT
CLOSURE STERI STRIP 1/2 X4 (GAUZE/BANDAGES/DRESSINGS) ×1 IMPLANT
CLOTH BEACON ORANGE TIMEOUT ST (SAFETY) ×2 IMPLANT
CONTAINER PREFILL 10% NBF 15ML (MISCELLANEOUS) IMPLANT
DRAPE SHEET LG 3/4 BI-LAMINATE (DRAPES) IMPLANT
DRSG OPSITE 11X17.75 LRG (GAUZE/BANDAGES/DRESSINGS) ×1 IMPLANT
DRSG OPSITE POSTOP 4X10 (GAUZE/BANDAGES/DRESSINGS) ×2 IMPLANT
DURAPREP 26ML APPLICATOR (WOUND CARE) ×2 IMPLANT
ELECT REM PT RETURN 9FT ADLT (ELECTROSURGICAL) ×2
ELECTRODE REM PT RTRN 9FT ADLT (ELECTROSURGICAL) ×1 IMPLANT
EXTRACTOR VACUUM M CUP 4 TUBE (SUCTIONS) IMPLANT
GLOVE BIO SURGEON STRL SZ7.5 (GLOVE) ×2 IMPLANT
GOWN STRL REUS W/TWL LRG LVL3 (GOWN DISPOSABLE) ×4 IMPLANT
KIT ABG SYR 3ML LUER SLIP (SYRINGE) IMPLANT
NDL HYPO 25X5/8 SAFETYGLIDE (NEEDLE) IMPLANT
NDL SPNL 20GX3.5 QUINCKE YW (NEEDLE) IMPLANT
NEEDLE HYPO 22GX1.5 SAFETY (NEEDLE) ×2 IMPLANT
NEEDLE HYPO 25X5/8 SAFETYGLIDE (NEEDLE) IMPLANT
NEEDLE SPNL 20GX3.5 QUINCKE YW (NEEDLE) IMPLANT
NS IRRIG 1000ML POUR BTL (IV SOLUTION) ×2 IMPLANT
PACK C SECTION WH (CUSTOM PROCEDURE TRAY) ×2 IMPLANT
PENCIL SMOKE EVAC W/HOLSTER (ELECTROSURGICAL) ×2 IMPLANT
SUT MNCRL 0 VIOLET CTX 36 (SUTURE) ×2 IMPLANT
SUT MNCRL AB 3-0 PS2 27 (SUTURE) ×1 IMPLANT
SUT MON AB 2-0 CT1 27 (SUTURE) ×2 IMPLANT
SUT MON AB-0 CT1 36 (SUTURE) ×4 IMPLANT
SUT MONOCRYL 0 CTX 36 (SUTURE) ×3
SUT PLAIN 0 NONE (SUTURE) IMPLANT
SUT PLAIN 2 0 (SUTURE)
SUT PLAIN 2 0 XLH (SUTURE) ×1 IMPLANT
SUT PLAIN ABS 2-0 CT1 27XMFL (SUTURE) IMPLANT
SYR 20CC LL (SYRINGE) IMPLANT
SYR CONTROL 10ML LL (SYRINGE) ×2 IMPLANT
TOWEL OR 17X24 6PK STRL BLUE (TOWEL DISPOSABLE) ×2 IMPLANT
TRAY FOLEY CATH SILVER 14FR (SET/KITS/TRAYS/PACK) ×2 IMPLANT

## 2014-12-08 NOTE — Transfer of Care (Signed)
Immediate Anesthesia Transfer of Care Note  Patient: Abigail RoanKendall M Heasley  Procedure(s) Performed: Procedure(s): CESAREAN SECTION (N/A)  Patient Location: PACU  Anesthesia Type:Epidural  Level of Consciousness: awake  Airway & Oxygen Therapy: Patient Spontanous Breathing  Post-op Assessment: Report given to RN and Post -op Vital signs reviewed and stable  Post vital signs: stable  Last Vitals:  Filed Vitals:   12/08/14 2151  BP: 114/52  Pulse: 112  Temp:   Resp:     Complications: No apparent anesthesia complications

## 2014-12-08 NOTE — H&P (Signed)
Abigail Duffy is a 34 y.o. female presenting for contractions.  Maternal Medical History:  Reason for admission: Contractions.   Contractions: Onset was 3-5 hours ago.   Frequency: regular.   Perceived severity is moderate.    Fetal activity: Perceived fetal activity is normal.   Last perceived fetal movement was within the past hour.    Prenatal complications: Polyhydramnios.   Prenatal Complications - Diabetes: none.    OB History    Gravida Para Term Preterm AB TAB SAB Ectopic Multiple Living   4    3  2 1        Past Medical History  Diagnosis Date  . Hx of varicella   . Hx of ectopic pregnancy   . Hypothyroidism   . 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 Tonsillectomy     Family History: family history includes Cancer in her paternal grandfather; Depression in her maternal grandfather and mother; Diabetes in her maternal grandmother and paternal grandmother; Heart disease in her maternal grandfather; Hypertension in her paternal grandfather and paternal grandmother; Hyperthyroidism in her paternal uncle; Hypothyroidism in her mother. Social History:  reports that she has never smoked. She has never used smokeless tobacco. She reports that she does not drink alcohol or use illicit drugs.   Prenatal Transfer Tool  Maternal Diabetes: No Genetic Screening: Normal Maternal Ultrasounds/Referrals: Normal Fetal Ultrasounds or other Referrals:  None Maternal Substance Abuse:  No Significant Maternal Medications:  Meds include: Syntroid Significant Maternal Lab Results:  None Other Comments:  None  Review of Systems  Constitutional: Negative.   All other systems reviewed and are negative.   Dilation: 4.5 Effacement (%): 90 Station: -1 Exam by:: Dr. Billy Coastaavon Blood pressure 122/64, pulse 93, temperature 98.3 F (36.8 C), temperature source Oral, resp. rate 16, height 5\' 4"  (1.626 m), weight 251  lb (113.853 kg), last menstrual period 02/27/2014, unknown if currently breastfeeding. Maternal Exam:  Uterine Assessment: Contraction strength is moderate.  Contraction frequency is regular.   Abdomen: Fetal presentation: vertex  Introitus: Normal vulva. Normal vagina.  Ferning test: not done.  Nitrazine test: not done. Amniotic fluid character: not assessed.  Pelvis: questionable for delivery.   Cervix: Cervix evaluated by digital exam.     Physical Exam  Nursing note and vitals reviewed. Constitutional: She is oriented to person, place, and time. She appears well-developed and well-nourished.  HENT:  Head: Normocephalic and atraumatic.  Neck: Normal range of motion. Neck supple.  Cardiovascular: Normal rate and regular rhythm.   Respiratory: Effort normal and breath sounds normal.  GI: Soft. Bowel sounds are normal.  Genitourinary: Vagina normal and uterus normal.  Musculoskeletal: Normal range of motion.  Neurological: She is alert and oriented to person, place, and time.  Skin: Skin is warm and dry.  Psychiatric: She has a normal mood and affect.    Prenatal labs: ABO, Rh: A/Positive/-- (03/24 0000) Antibody: Negative (03/24 0000) Rubella: Immune (03/24 0000) RPR: Nonreactive (03/24 0000)  HBsAg: Negative (03/24 0000)  HIV: Non-reactive (03/24 0000)  GBS: Positive (03/24 0000)   Assessment/Plan: 40 weeks Early labor GBS Bacteriuria Admit  PCN   Amarachukwu Lakatos J 12/08/2014, 9:06 AM

## 2014-12-08 NOTE — Progress Notes (Signed)
Fentanyl waste of 59.526ml documented in Pyxis in error on this patient.  Discussed with pharmacy and documented on paper sent back to them.

## 2014-12-08 NOTE — Progress Notes (Signed)
Notified pt requested to stop pushing. MD on his way

## 2014-12-08 NOTE — Op Note (Signed)
Cesarean Section Procedure Note  Indications: chorioamnionitis and failure to progress: arrest of descent  Pre-operative Diagnosis: 40 week 4 day pregnancy.  Post-operative Diagnosis: same  Surgeon: Lenoard AdenAAVON,Cliff Damiani J   Assistants: Montez HagemanBailey, FACM, CNM  Anesthesia: Epidural anesthesia and Local anesthesia 0.25.% bupivacaine  ASA Class: 2  Procedure Details  The patient was seen in the Holding Room. The risks, benefits, complications, treatment options, and expected outcomes were discussed with the patient.  The patient concurred with the proposed plan, giving informed consent. The risks of anesthesia, infection, bleeding and possible injury to other organs discussed. Injury to bowel, bladder, or ureter with possible need for repair discussed. Possible need for transfusion with secondary risks of hepatitis or HIV acquisition discussed. Post operative complications to include but not limited to DVT, PE and Pneumonia noted. The site of surgery properly noted/marked. The patient was taken to Operating Room # 9, identified as Assunta FoundKendall M Wynia and the procedure verified as C-Section Delivery. A Time Out was held and the above information confirmed.  After induction of anesthesia, the patient was draped and prepped in the usual sterile manner. A Pfannenstiel incision was made and carried down through the subcutaneous tissue to the fascia. Fascial incision was made and extended transversely using Mayo scissors. The fascia was separated from the underlying rectus tissue superiorly and inferiorly. The peritoneum was identified and entered. Peritoneal incision was extended longitudinally. The utero-vesical peritoneal reflection was incised transversely and the bladder flap was bluntly freed from the lower uterine segment. A low transverse uterine incision(Kerr hysterotomy) was made. Delivered from ROP presentation was a  female with Apgar scores of 8 at one minute and 9 at five minutes. Bulb suctioning gently  performed. Neonatal team in attendance.After the umbilical cord was clamped and cut cord blood was obtained for evaluation. The placenta was removed intact and appeared normal. The uterus was curetted with a dry lap pack. Good hemostasis was noted.The uterine outline, tubes and ovaries appeared normal. The uterine incision was closed with running locked sutures of 0 Monocryl x 2 layers. Hemostasis was observed. Lavage was carried out until clear.The parietal peritoneum was closed with a running 2-0 Monocryl suture. The fascia was then reapproximated with running sutures of 0 Monocryl. The skin was reapproximated with 3-0 monocryl after [place] closure with 2-0 plain.  Instrument, sponge, and needle counts were correct prior the abdominal closure and at the conclusion of the case.   Findings: FTLM, OP, fundal placenta  Estimated Blood Loss:  500         Drains: foley                 Specimens: placenta                 Complications:  None; patient tolerated the procedure well.         Disposition: PACU - hemodynamically stable.         Condition: stable  Attending Attestation: I performed the procedure.

## 2014-12-08 NOTE — Anesthesia Preprocedure Evaluation (Signed)
Anesthesia Evaluation  Patient identified by MRN, date of birth, ID band Patient awake    Reviewed: Allergy & Precautions, Patient's Chart, lab work & pertinent test results  Airway Mallampati: III  TM Distance: >3 FB Neck ROM: Full    Dental no notable dental hx. (+) Teeth Intact   Pulmonary neg pulmonary ROS,    Pulmonary exam normal breath sounds clear to auscultation       Cardiovascular negative cardio ROS Normal cardiovascular exam Rhythm:Regular Rate:Normal     Neuro/Psych negative neurological ROS  negative psych ROS   GI/Hepatic Neg liver ROS, GERD  Medicated and Controlled,  Endo/Other  Hypothyroidism Morbid obesity  Renal/GU negative Renal ROS  negative genitourinary   Musculoskeletal negative musculoskeletal ROS (+)   Abdominal (+) + obese,   Peds  Hematology Heterozygous for MTHFR mutation   Anesthesia Other Findings   Reproductive/Obstetrics (+) Pregnancy                             Anesthesia Physical Anesthesia Plan  ASA: III  Anesthesia Plan: Epidural   Post-op Pain Management:    Induction:   Airway Management Planned: Natural Airway  Additional Equipment:   Intra-op Plan:   Post-operative Plan:   Informed Consent: I have reviewed the patients History and Physical, chart, labs and discussed the procedure including the risks, benefits and alternatives for the proposed anesthesia with the patient or authorized representative who has indicated his/her understanding and acceptance.     Plan Discussed with: Anesthesiologist  Anesthesia Plan Comments:         Anesthesia Quick Evaluation

## 2014-12-08 NOTE — MAU Note (Signed)
PT  SAYS  SHE HURT BAD AT 0400.    VE  IN OFFICE-  DR TAAVON-  3  CM.     DENIES HSV AND   MRSA.  GBS- POSITIVE.

## 2014-12-08 NOTE — Progress Notes (Signed)
Abigail Duffy is a 34 y.o. G4P0030 at 5371w4d by LMP admitted for active labor  Subjective: comfortable  Objective: BP 103/53 mmHg  Pulse 71  Temp(Src) 98 F (36.7 C) (Oral)  Resp 20  Ht 5\' 4"  (1.626 m)  Wt 251 lb (113.853 kg)  BMI 43.06 kg/m2  SpO2 99%  LMP 02/27/2014      FHT:  FHR: 145 bpm, variability: moderate,  accelerations:  Present,  decelerations:  Absent UC:   regular, every 2 minutes SVE:   Dilation: 4.5 Effacement (%): 90 Station: -1 Exam by:: Dr. Billy Coastaavon  IUPC placed  Labs: Lab Results  Component Value Date   WBC 15.6* 12/08/2014   HGB 13.7 12/08/2014   HCT 40.2 12/08/2014   MCV 88.4 12/08/2014   PLT 160 12/08/2014    Assessment / Plan: Protracted active phase- hypotonic  Labor: Progressing normally-start augmentation Preeclampsia:  no signs or symptoms of toxicity Fetal Wellbeing:  Category I Pain Control:  Epidural I/D:  n/a Anticipated MOD:  NSVD  Abigail Duffy J 12/08/2014, 10:45 AM

## 2014-12-08 NOTE — Progress Notes (Signed)
Md discussing Csection with pt- consents signed.

## 2014-12-08 NOTE — Progress Notes (Signed)
Called Dr. Billy Coasttaavon for update on pushing,. Pt pushed 1 hour- no real change in station. Maternal temp 101.00- Tylenol given. Md orders to continue pushing

## 2014-12-08 NOTE — Progress Notes (Signed)
Pt requested to stop pushing

## 2014-12-08 NOTE — Progress Notes (Signed)
Abigail Duffy is a 34 y.o. G4P0030 at 9634w4d by LMP admitted for active labor  Subjective: Tired of pushing  Objective: BP 114/52 mmHg  Pulse 112  Temp(Src) 101 F (38.3 C) (Oral)  Resp 18  Ht 5\' 4"  (1.626 m)  Wt 251 lb (113.853 kg)  BMI 43.06 kg/m2  SpO2 99%  LMP 02/27/2014 I/O last 3 completed shifts: In: -  Out: 400 [Urine:400]    FHT:  FHR: 165 bpm, variability: moderate,  accelerations:  Present,  decelerations:  Absent UC:   regular, every 2 minutes SVE:   Dilation: 10 Effacement (%): 100 Station: 0 Exam by:: Fabrice Dyal   No descent  Labs: Lab Results  Component Value Date   WBC 15.6* 12/08/2014   HGB 13.7 12/08/2014   HCT 40.2 12/08/2014   MCV 88.4 12/08/2014   PLT 160 12/08/2014    Assessment / Plan: Arrest of decent  Maternal fever  Labor: no progress Preeclampsia:  no signs or symptoms of toxicity Fetal Wellbeing:  Category I and Category II Pain Control:  Epidural I/D:  n/a Anticipated MOD:  Proceed with csection. Consent done.  [name] J 12/08/2014, 10:04 PM

## 2014-12-08 NOTE — Anesthesia Procedure Notes (Signed)
Epidural Patient location during procedure: OB Start time: 12/08/2014 9:31 AM  Staffing Anesthesiologist: Mal AmabileFOSTER, Keldrick Pomplun Performed by: anesthesiologist   Preanesthetic Checklist Completed: patient identified, site marked, surgical consent, pre-op evaluation, timeout performed, IV checked, risks and benefits discussed and monitors and equipment checked  Epidural Patient position: sitting Prep: site prepped and draped and DuraPrep Patient monitoring: continuous pulse ox and blood pressure Approach: midline Location: L3-L4 Injection technique: LOR air  Needle:  Needle type: Tuohy  Needle gauge: 17 G Needle length: 9 cm and 9 Needle insertion depth: 6 cm Catheter type: closed end flexible Catheter size: 19 Gauge Catheter at skin depth: 11 cm Test dose: negative and Other  Assessment Events: blood not aspirated, injection not painful, no injection resistance, negative IV test and no paresthesia  Additional Notes Patient identified. Risks and benefits discussed including failed block, incomplete  Pain control, post dural puncture headache, nerve damage, paralysis, blood pressure Changes, nausea, vomiting, reactions to medications-both toxic and allergic and post Partum back pain. All questions were answered. Patient expressed understanding and wished to proceed. Sterile technique was used throughout procedure. Epidural site was Dressed with sterile barrier dressing. No paresthesias, signs of intravascular injection Or signs of intrathecal spread were encountered.  Patient was more comfortable after the epidural was dosed. Please see RN's note for documentation of vital signs and FHR which are stable.

## 2014-12-09 ENCOUNTER — Encounter (HOSPITAL_COMMUNITY): Payer: Self-pay | Admitting: Obstetrics and Gynecology

## 2014-12-09 LAB — CBC
HCT: 36.8 % (ref 36.0–46.0)
Hemoglobin: 12.1 g/dL (ref 12.0–15.0)
MCH: 29.7 pg (ref 26.0–34.0)
MCHC: 32.9 g/dL (ref 30.0–36.0)
MCV: 90.4 fL (ref 78.0–100.0)
PLATELETS: 139 10*3/uL — AB (ref 150–400)
RBC: 4.07 MIL/uL (ref 3.87–5.11)
RDW: 15.9 % — AB (ref 11.5–15.5)
WBC: 19.7 10*3/uL — AB (ref 4.0–10.5)

## 2014-12-09 MED ORDER — ONDANSETRON HCL 4 MG/2ML IJ SOLN: 4.0000 mg | Freq: Three times a day (TID) | INTRAMUSCULAR | Status: DC | PRN

## 2014-12-09 MED ORDER — SCOPOLAMINE 1 MG/3DAYS TD PT72
1.0000 | MEDICATED_PATCH | Freq: Once | TRANSDERMAL | Status: DC
  Filled 2014-12-09: qty 1

## 2014-12-09 MED ORDER — LACTATED RINGERS IV SOLN
INTRAVENOUS | Status: DC
  Administered 2014-12-09: 02:00:00 via INTRAVENOUS

## 2014-12-09 MED ORDER — PRENATAL MULTIVITAMIN CH
1.0000 | ORAL_TABLET | Freq: Every day | ORAL | Status: DC
Start: 2014-12-09 — End: 2014-12-11
  Administered 2014-12-09 – 2014-12-11 (×3): 1 via ORAL
  Filled 2014-12-09 (×3): qty 1

## 2014-12-09 MED ORDER — SIMETHICONE 80 MG PO CHEW
80.0000 mg | CHEWABLE_TABLET | Freq: Three times a day (TID) | ORAL | Status: DC
Start: 2014-12-09 — End: 2014-12-11
  Administered 2014-12-09 – 2014-12-10 (×4): 80 mg via ORAL
  Filled 2014-12-09 (×6): qty 1

## 2014-12-09 MED ORDER — LEVOTHYROXINE SODIUM 75 MCG PO TABS
75.0000 ug | ORAL_TABLET | Freq: Every day | ORAL | Status: DC
  Administered 2014-12-09 – 2014-12-11 (×3): 75 ug via ORAL
  Filled 2014-12-09 (×4): qty 1

## 2014-12-09 MED ORDER — METHYLERGONOVINE MALEATE 0.2 MG PO TABS: 0.2000 mg | ORAL_TABLET | ORAL | Status: DC | PRN

## 2014-12-09 MED ORDER — NALBUPHINE HCL 10 MG/ML IJ SOLN: 5.0000 mg | Freq: Once | INTRAMUSCULAR | Status: DC | PRN

## 2014-12-09 MED ORDER — ACETAMINOPHEN 325 MG PO TABS: 650.0000 mg | ORAL_TABLET | ORAL | Status: DC | PRN

## 2014-12-09 MED ORDER — DIPHENHYDRAMINE HCL 25 MG PO CAPS: 25.0000 mg | ORAL_CAPSULE | ORAL | Status: DC | PRN

## 2014-12-09 MED ORDER — DIPHENHYDRAMINE HCL 50 MG/ML IJ SOLN: 12.5000 mg | INTRAMUSCULAR | Status: DC | PRN

## 2014-12-09 MED ORDER — LANOLIN HYDROUS EX OINT: 1.0000 "application " | TOPICAL_OINTMENT | CUTANEOUS | Status: DC | PRN

## 2014-12-09 MED ORDER — TETANUS-DIPHTH-ACELL PERTUSSIS 5-2.5-18.5 LF-MCG/0.5 IM SUSP: 0.5000 mL | Freq: Once | INTRAMUSCULAR | Status: DC

## 2014-12-09 MED ORDER — OXYCODONE-ACETAMINOPHEN 5-325 MG PO TABS
1.0000 | ORAL_TABLET | ORAL | Status: DC | PRN
  Administered 2014-12-10 – 2014-12-11 (×5): 1 via ORAL
  Filled 2014-12-09 (×5): qty 1

## 2014-12-09 MED ORDER — SENNOSIDES-DOCUSATE SODIUM 8.6-50 MG PO TABS
2.0000 | ORAL_TABLET | ORAL | Status: DC
Start: 2014-12-09 — End: 2014-12-11
  Administered 2014-12-09 – 2014-12-10 (×2): 2 via ORAL
  Filled 2014-12-09 (×2): qty 2

## 2014-12-09 MED ORDER — NALBUPHINE HCL 10 MG/ML IJ SOLN: 5.0000 mg | INTRAMUSCULAR | Status: DC | PRN

## 2014-12-09 MED ORDER — METHYLERGONOVINE MALEATE 0.2 MG/ML IJ SOLN: 0.2000 mg | INTRAMUSCULAR | Status: DC | PRN

## 2014-12-09 MED ORDER — ZOLPIDEM TARTRATE 5 MG PO TABS: 5.0000 mg | ORAL_TABLET | Freq: Every evening | ORAL | Status: DC | PRN

## 2014-12-09 MED ORDER — DIPHENHYDRAMINE HCL 25 MG PO CAPS: 25.0000 mg | ORAL_CAPSULE | Freq: Four times a day (QID) | ORAL | Status: DC | PRN

## 2014-12-09 MED ORDER — MENTHOL 3 MG MT LOZG: 1.0000 | LOZENGE | OROMUCOSAL | Status: DC | PRN

## 2014-12-09 MED ORDER — IBUPROFEN 600 MG PO TABS
600.0000 mg | ORAL_TABLET | Freq: Four times a day (QID) | ORAL | Status: DC
Start: 2014-12-09 — End: 2014-12-11
  Administered 2014-12-09 – 2014-12-11 (×11): 600 mg via ORAL
  Filled 2014-12-09 (×11): qty 1

## 2014-12-09 MED ORDER — NALOXONE HCL 2 MG/2ML IJ SOSY
1.0000 ug/kg/h | PREFILLED_SYRINGE | INTRAVENOUS | Status: DC | PRN
  Filled 2014-12-09: qty 2

## 2014-12-09 MED ORDER — NALOXONE HCL 0.4 MG/ML IJ SOLN: 0.4000 mg | INTRAMUSCULAR | Status: DC | PRN

## 2014-12-09 MED ORDER — SIMETHICONE 80 MG PO CHEW
80.0000 mg | CHEWABLE_TABLET | ORAL | Status: DC
  Administered 2014-12-09 – 2014-12-10 (×2): 80 mg via ORAL
  Filled 2014-12-09 (×2): qty 1

## 2014-12-09 MED ORDER — OXYTOCIN 40 UNITS IN LACTATED RINGERS INFUSION - SIMPLE MED: 62.5000 mL/h | INTRAVENOUS | Status: AC

## 2014-12-09 MED ORDER — SIMETHICONE 80 MG PO CHEW
80.0000 mg | CHEWABLE_TABLET | ORAL | Status: DC | PRN
  Administered 2014-12-11: 80 mg via ORAL
  Filled 2014-12-09: qty 1

## 2014-12-09 MED ORDER — WITCH HAZEL-GLYCERIN EX PADS: 1.0000 "application " | MEDICATED_PAD | CUTANEOUS | Status: DC | PRN

## 2014-12-09 MED ORDER — OXYCODONE-ACETAMINOPHEN 5-325 MG PO TABS: 2.0000 | ORAL_TABLET | ORAL | Status: DC | PRN

## 2014-12-09 MED ORDER — SODIUM CHLORIDE 0.9 % IJ SOLN: 3.0000 mL | INTRAMUSCULAR | Status: DC | PRN

## 2014-12-09 MED ORDER — DIBUCAINE 1 % RE OINT: 1.0000 "application " | TOPICAL_OINTMENT | RECTAL | Status: DC | PRN

## 2014-12-09 NOTE — Lactation Note (Signed)
This note was copied from the chart of Abigail Duffy [place]. Lactation Consultation Note   With this term NICU baby, now 5416 hours old, and latching for the first time. The baby latches eagerly , in cross cradle hold, with good breast movement. On exam of baby's mouth, he has a tight upper lip frenulum, to the gum line, and a posterior, short thick frenulum, causing a slight cleft in his tongue tip, with extension. With finger sucking, he could not pull my finger into his mouth at all. He also has a  high, round palate. After latch, mom's nipples were pinched, forming a nipple stripe. I fitted mom with a 24 nipple shield, but the baby refused to latch with this. Mom was fine with latching him without for now, but it aware she may begin getting sore.   Patient Name: Abigail Duffy Bolduc ZOXWR'UToday's Date: 12/09/2014 Reason for consult: Follow-up assessment;NICU baby   Maternal Data    Feeding Feeding Type: Breast Fed Length of feed: 15 min  LATCH Score/Interventions Latch: Repeated attempts needed to sustain latch, nipple held in mouth throughout feeding, stimulation needed to elicit sucking reflex. (baby latches easily, but after a few minutes, pulls of and again latches easily.) Intervention(s): Adjust position;Assist with latch;Breast compression  Audible Swallowing: A few with stimulation  Type of Nipple: Everted at rest and after stimulation  Comfort (Breast/Nipple): Soft / non-tender (nipple stripes already seen after first latch - no breakdown, and mom denies pain at this time. )     Hold (Positioning): Assistance needed to correctly position infant at breast and maintain latch. Intervention(s): Breastfeeding basics reviewed;Support Pillows;Position options;Skin to skin  LATCH Score: 7  Lactation Tools Discussed/Used WIC Program: No Pump Review: Setup, frequency, and cleaning;Milk Storage;Other (comment) (hand expression, premie setting, BNICU booklet review) Initiated by:: bedside  RN Date initiated:: 12/08/14   Consult Status Consult Status: Follow-up Date: 12/10/14 Follow-up type: In-patient    Alfred LevinsLee, Taevyn Hausen Anne 12/09/2014, 3:36 PM

## 2014-12-09 NOTE — Anesthesia Postprocedure Evaluation (Signed)
  Anesthesia Post-op Note  Patient: Abigail Duffy  Procedure(s) Performed: Procedure(s) (LRB): CESAREAN SECTION (N/A)  Patient Location: PACU  Anesthesia Type: Epidural  Level of Consciousness: awake and alert   Airway and Oxygen Therapy: Patient Spontanous Breathing  Post-op Pain: mild  Post-op Assessment: Post-op Vital signs reviewed, Patient's Cardiovascular Status Stable, Respiratory Function Stable, Patent Airway and No signs of Nausea or vomiting  Last Vitals:  Filed Vitals:   12/09/14 0000  BP: 119/65  Pulse: 96  Temp:   Resp: 20    Post-op Vital Signs: stable   Complications: No apparent anesthesia complications

## 2014-12-09 NOTE — Anesthesia Postprocedure Evaluation (Signed)
  Anesthesia Post-op Note  Patient: Rolinda RoanKendall M Morsch  Procedure(s) Performed: Procedure(s): CESAREAN SECTION (N/A)  Patient Location: [facility]  Anesthesia Type:Epidural  Level of Consciousness: awake, alert  and oriented  Airway and Oxygen Therapy: Patient Spontanous Breathing  Post-op Pain: none  Post-op Assessment: Post-op Vital signs reviewed, Patient's Cardiovascular Status Stable, Respiratory Function Stable, Patent Airway, No signs of Nausea or vomiting, Adequate PO intake, Pain level controlled, No headache, No backache and Patient able to bend at knees LLE Motor Response: Purposeful movement LLE Sensation: Numbness, Tingling RLE Motor Response: Purposeful movement RLE Sensation: Tingling      Post-op Vital Signs: Reviewed and stable  Last Vitals:  Filed Vitals:   12/09/14 0528  BP: 109/72  Pulse: 73  Temp: 36.7 C  Resp: 18    Complications: No apparent anesthesia complications

## 2014-12-09 NOTE — Addendum Note (Signed)
Addendum  created 12/09/14 0751 by Junious SilkMelinda Natika Geyer, CRNA   Modules edited: Notes Section   Notes Section:  File: [id number]

## 2014-12-09 NOTE — Progress Notes (Signed)
Patient ID: Abigail Duffy, female   DOB: [birth year], 34 y.o.   MRN: [id number]  Subjective: POD# 1 Information for the patient's newborn:  Romelle StarcherBurkhead, Boy Melysa [[id number]][[id number]]  female  / circ in NICU  Reports feeling a little sore, but well Feeding: breast and bottle - in NICU Patient reports tolerating PO.  Breast symptoms: none Pain controlled with ibuprofen (OTC) and narcotic analgesics including Percocet Denies HA/SOB/C/P/N/V/dizziness. Flatus none. No BM. She reports vaginal bleeding as normal, without clots.  She is ambulating, Foley indwelling, draining clear urine to gravity.    Objective:   VS:  Filed Vitals:   12/09/14 0139 12/09/14 0305 12/09/14 0404 12/09/14 0528  BP: 118/55 111/54 104/60 109/72  Pulse: 98 95 85 73  Temp: 98.7 F (37.1 C) 98.3 F (36.8 C) 97.7 F (36.5 C) 98 F (36.7 C)  TempSrc:  Oral Oral Oral  Resp: 18 18 18 18   Height:      Weight:      SpO2: 93% 96% 96% 96%     Intake/Output Summary (Last 24 hours) at 12/09/14 0744 Last data filed at 12/09/14 0710  Gross per 24 hour  Intake 5051.18 ml  Output   2375 ml  Net 2676.18 ml        Recent Labs  12/08/14 0736 12/09/14 0615  WBC 15.6* 19.7*  HGB 13.7 12.1  HCT 40.2 36.8  PLT 160 139*     Blood type: A POS (10/28 0736)  Rubella: Immune (03/24 0000)     Physical Exam:   General: alert, cooperative, no distress and moderately obese  CV: Regular rate and rhythm, S1S2 present or without murmur or extra heart sounds  Resp: clear  Abdomen: soft, nontender, normal bowel sounds  Incision: clean, dry, intact and skin well-approximated with sutures  Uterine Fundus: firm, 1 FB below umbilicus, nontender  Lochia: minimal  Ext: extremities normal, atraumatic, no cyanosis or edema, Homans sign is negative, no sign of DVT and no edema, redness or tenderness in the calves or thighs   Assessment/Plan: 34 y.o.   POD# 1.  S/P Cesarean Delivery.  Indications: arrest of descent                  Principal Problem:   Postpartum care following cesarean delivery (10/28) Active Problems:   Active labor  Doing well, stable.               Regular diet as tolerated D/C foley per unit protocol Ambulate Routine post-op care  Kenard GowerAWSON, Leonard Feigel, M, MSN, CNM 12/09/2014, 7:44 AM

## 2014-12-10 NOTE — Progress Notes (Signed)
Patient ID: Abigail Duffy, female   DOB: December 06, 1980, 34 y.o.   MRN: [id number] Subjective: POD# 2 Information for the patient's newborn:  Romelle StarcherBurkhead, Boy Felesia [[id number]][[id number]]  female  / circ in NICU - planning  Reports feeling a little, but well Feeding: breast Patient reports tolerating PO.  Breast symptoms: "producing little colostrum" Pain controlled with ibuprofen (OTC) and narcotic analgesics including Percocet - will need to increase Percocet to 2 tabs for next doses Denies HA/SOB/C/P/N/V/dizziness. Flatus present. No BM. She reports vaginal bleeding as normal, without clots.  She is ambulating, urinating without difficult.     Objective:   VS:  Filed Vitals:   12/09/14 1400 12/09/14 1737 12/09/14 2124 12/10/14 0548  BP: 101/60 112/57 109/55 101/65  Pulse: 82 81 81 91  Temp: 98.8 F (37.1 C) 98 F (36.7 C) 98.9 F (37.2 C) 97.9 F (36.6 C)  TempSrc: Oral Oral Oral Oral  Resp: 18 18 20 20   Height:      Weight:      SpO2: 100% 99% 100% 100%     Intake/Output Summary (Last 24 hours) at 12/10/14 0931 Last data filed at 12/09/14 1530  Gross per 24 hour  Intake    450 ml  Output    750 ml  Net   -300 ml        Recent Labs  12/08/14 0736 12/09/14 0615  WBC 15.6* 19.7*  HGB 13.7 12.1  HCT 40.2 36.8  PLT 160 139*     Blood type: A POS (10/28 0736)  Rubella: Immune (03/24 0000)     Physical Exam:   General: alert, cooperative, no distress and moderately obese  Abdomen: soft, nontender, normal bowel sounds  Incision: serous drainage present - small amount on Honeycomb / skin well-approximated with sutures  Uterine Fundus: firm, 2 FB below umbilicus, nontender  Lochia: minimal  Ext: extremities normal, atraumatic, no cyanosis, and edema 1+   Assessment/Plan: 34 y.o.   POD# 2.  S/P Cesarean Delivery.  Indications: arrest of descent                Principal Problem:   Postpartum care following cesarean delivery (10/28) Active Problems:   Active  labor  Doing well, stable.               Regular diet as tolerated Ambulate 2-3 times today Routine post-op care  Kenard GowerAWSON, Torin Whisner, M, MSN, CNM 12/10/2014, 9:20 AM

## 2014-12-10 NOTE — Lactation Note (Signed)
This note was copied from the chart of Abigail Jolayne HainesKendall Pree. Lactation Consultation Note; Mom reports she is pumping but only obtaining a few drops of Colostrum. Reassurance given Reports she is putting the baby to the breast in the NICU and he is latching well with no pain. Has discussed 2 week pump rental with previous LC for home until she can get one from insurance company. No questions at present. Asking about resources after DC.  Reviewed BFSG and OP appointments for assist after DC. Will follow in NICU tomorrow.   Patient Name: Abigail Duffy ZOXWR'UToday's Date: 12/10/2014 Reason for consult: Follow-up assessment;NICU baby   Maternal Data    Feeding Feeding Type: Breast Fed  LATCH Score/Interventions Latch: Grasps breast easily, tongue down, lips flanged, rhythmical sucking.  Audible Swallowing: A few with stimulation  Type of Nipple: Everted at rest and after stimulation  Comfort (Breast/Nipple): Soft / non-tender     Hold (Positioning): No assistance needed to correctly position infant at breast. Intervention(s): Skin to skin  LATCH Score: 9  Lactation Tools Discussed/Used     Consult Status Consult Status: Follow-up Date: 12/11/14 Follow-up type: In-patient    Abigail Duffy, Abigail Duffy 12/10/2014, 2:03 PM

## 2014-12-11 ENCOUNTER — Inpatient Hospital Stay (HOSPITAL_COMMUNITY): Admission: RE | Admit: 2014-12-11 | Payer: BLUE CROSS/BLUE SHIELD | Source: Ambulatory Visit

## 2014-12-11 MED ORDER — LEVOTHYROXINE SODIUM 75 MCG PO TABS: 75.0000 ug | ORAL_TABLET | Freq: Every day | ORAL | Status: AC

## 2014-12-11 MED ORDER — IBUPROFEN 600 MG PO TABS: 600.0000 mg | ORAL_TABLET | Freq: Four times a day (QID) | ORAL | Status: AC

## 2014-12-11 MED ORDER — OXYCODONE-ACETAMINOPHEN 5-325 MG PO TABS: 1.0000 | ORAL_TABLET | ORAL | Status: AC | PRN

## 2014-12-11 NOTE — Progress Notes (Signed)
Pt is discharged in the care of husband per ambulatory with [facility].[employer]. Escort. Denies any pain or discomfort. Infant will be Discharged from  [facility] to parents . Understands all discharged instructions well Questions asked and answered. No equipment needed for home use.

## 2014-12-11 NOTE — Progress Notes (Signed)
CSW acknowledges NICU admission.    Patient screened out for psychosocial assessment since none of the following apply:  Psychosocial stressors documented in mother or baby's chart  Gestation less than 32 weeks  Code at delivery   Critically ill infant  Infant with anomalies  Please contact the Clinical Social Worker if specific needs arise, or by MOB's request.   MD note from yesterday states "preparing for tentative discharge" on 12/11/14.    

## 2014-12-11 NOTE — Discharge Summary (Signed)
POSTOPERATIVE DISCHARGE SUMMARY:  Patient ID: Abigail Duffy MRN: [id number] DOB/AGE: [birth year] 34 y.o.  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  Discharge date:  12/11/2014 Discharge Diagnoses: POD 3 s/p cesarean section for arrest of descent and chorioamnionitis - resolved  Prenatal history: [id number]   EDC : 12/04/2014, by Last Menstrual Period  [facility] at [facility]  Primary provider : Taavon  Prenatal Labs: ABO, Rh: --/--/A POS, A POS (10/28 0736)  Antibody: NEG (10/28 0736) Rubella: Immune (03/24 0000)   RPR: Non Reactive (10/28 0736)  HBsAg: Negative (03/24 0000)  HIV: Non-reactive (03/24 0000)  GBS: Positive (03/24 0000)   Medical / Surgical History :  Past medical history:  Past Medical History  Diagnosis Date  . Hx of varicella   . Hx of ectopic pregnancy   . Hypothyroidism   . 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 Postpartum care following cesarean delivery (10/28) 12/09/2014    Past surgical history:  Past Surgical History  Procedure Laterality Date  . Tonsillectomy      Family History:  Family History  Problem Relation Age of Onset  . Depression Mother   . Hypothyroidism Mother   . Hyperthyroidism Paternal Uncle   . Diabetes Maternal Grandmother   . Heart disease Maternal Grandfather   . Depression Maternal Grandfather   . Diabetes Paternal Grandmother   . Hypertension Paternal Grandmother   . Cancer Paternal Grandfather     prostate  . Hypertension Paternal Grandfather     Social History:  reports that she has never smoked. She has never used smokeless tobacco. She reports that she does not drink alcohol or use illicit drugs.  Allergies: Review of patient's allergies indicates no known allergies.   Current Medications at time of admission:  Prior to Admission medications   Medication Sig Start Date End Date Taking? Authorizing Provider  folic  acid (FOLVITE) 1 MG tablet Take 1 mg by mouth at bedtime.    Yes Historical Provider, MD  levothyroxine (SYNTHROID, LEVOTHROID) 75 MCG tablet Take 75 mcg by mouth daily before breakfast.   Yes Historical Provider, MD  Prenatal Vit-Fe Fumarate-FA (PRENATAL MULTIVITAMIN) TABS tablet Take 1 tablet by mouth at bedtime.    Yes Historical Provider, MD  vitamin C (ASCORBIC ACID) 500 MG tablet Take 500 mg by mouth at bedtime.   Yes Historical Provider, MD  ibuprofen (ADVIL,MOTRIN) 600 MG tablet Take 1 tablet (600 mg total) by mouth every 6 (six) hours. 12/11/14   Marlinda Mike, CNM  levothyroxine (SYNTHROID, LEVOTHROID) 75 MCG tablet Take 1 tablet (75 mcg total) by mouth daily before breakfast. 12/11/14   Marlinda Mike, CNM  oxyCODONE-acetaminophen (PERCOCET/ROXICET) 5-325 MG tablet Take 1 tablet by mouth every 4 (four) hours as needed (for pain scale 4-7). 12/11/14   Marlinda Mike, CNM    Intrapartum Course:  Admit for labor with labor progression to 10cm dilation with protracted labor descent Pain management: epidural Complicated by: arrest of descent / OP presentation / maternal fever /  Interventions required: cesarean section  Procedures: Cesarean section delivery on 12/08/2014 with delivery of Female newborn by Dr Billy Coast   See operative report for further details APGAR (1 MIN): 8   APGAR (5 MINS): 9    Postoperative / postpartum course:  Uncomplicated with discharge on POD 3   Discharge Instructions:  Discharged Condition: stable  Activity: pelvic rest and postoperative restrictions x 2   Diet: routine  Medications:  Medication List    TAKE these medications        folic acid 1 MG tablet  Commonly known as:  FOLVITE  Take 1 mg by mouth at bedtime.     ibuprofen 600 MG tablet  Commonly known as:  ADVIL,MOTRIN  Take 1 tablet (600 mg total) by mouth every 6 (six) hours.     levothyroxine 75 MCG tablet  Commonly known as:  SYNTHROID, LEVOTHROID  Take 75 mcg by mouth daily  before breakfast.     levothyroxine 75 MCG tablet  Commonly known as:  SYNTHROID, LEVOTHROID  Take 1 tablet (75 mcg total) by mouth daily before breakfast.     oxyCODONE-acetaminophen 5-325 MG tablet  Commonly known as:  PERCOCET/ROXICET  Take 1 tablet by mouth every 4 (four) hours as needed (for pain scale 4-7).     prenatal multivitamin Tabs tablet  Take 1 tablet by mouth at bedtime.     vitamin C 500 MG tablet  Commonly known as:  ASCORBIC ACID  Take 500 mg by mouth at bedtime.        Wound Care: keep clean and dry / remove honeycomb POD 5 Postpartum Instructions: [facility] discharge booklet - instructions reviewed  Discharge to: Home  Follow up :   [facility] in 6 weeks for routine postpartum visit with Dr Billy Coastaavon                Signed: Marlinda MikeBAILEY, TANYA CNM, MSN, [facility] 12/11/2014, 12:11 PM

## 2014-12-11 NOTE — Progress Notes (Signed)
POSTOPERATIVE DAY # 3 S/P CS   S:         Reports feeling well             Tolerating po intake / no nausea / no vomiting / + flatus / + BM             Bleeding is light             Pain controlled with motrin and percocet             Up ad lib / ambulatory/ voiding QS  Newborn breast feeding in NICU  / Circumcision planned today   O:  VS: BP 123/65 mmHg  Pulse 99  Temp(Src) 98.7 F (37.1 C) (Oral)  Resp 18  Ht 5\' 4"  (1.626 m)  Wt 114.08 kg (251 lb 8 oz)  BMI 43.15 kg/m2  SpO2 98%  LMP 02/27/2014  Breastfeeding? Unknown   LABS:               Recent Labs  12/09/14 0615  WBC 19.7*  HGB 12.1  PLT 139*               Bloodtype: --/--/A POS, A POS (10/28 0736)  Rubella: Immune (03/24 0000)                                          Physical Exam:             Alert and Oriented X3  Lungs: Clear and unlabored  Heart: regular rate and rhythm / no mumurs  Abdomen: soft, non-tender, non-distended             Fundus: firm, non-tender, Ueven             Dressing intact honeycomb              Incision:  approximated with suture / no erythema / no ecchymosis / dried drainage  Perineum: intact  Lochia: light  Extremities: 1+ pedaledema, no calf pain or tenderness, negative Homans  A:        POD # 3 S/P CS              P:        Routine postoperative care              DC home - [facility] booklet - instructions reviewed - may room-in with newborn remains additional day     Marlinda MikeBAILEY, Seren Chaloux CNM, MSN, [facility] 12/11/2014, 10:02 AM

## 2014-12-11 NOTE — Lactation Note (Addendum)
This note was copied from the chart of Abigail Duffy. Lactation Consultation Note  Patient Name: Abigail Duffy Date: 12/11/2014 Reason for consult: Follow-up assessment;NICU baby NICU baby 23 hours old. Met with mom first in NICU and baby had just finished nursing for 30 minutes. Mom's nipple round when baby pulled off breast. Mom states that she has some nipple discomfort, but is using EBM after nursing, and is not having discomfort while nursing. Mom states that she has not been pumping regularly, but has had baby at breast for multiple feedings. Discussed stimulation of breasts, and normal progression of milk coming to full volume. Enc mom to nurse with cues, supplement with EBM/formula, and then post-pump for 15 minutes--followed by hand expression. Assisted mom with hand expression with a small amount of colostrum from both breast. Mom states that baby will be circumcised today. Discussed how this impacts BF. Mom aware of pumping rooms in NICU. Enc mom to discuss thyroid levels with HCP as this can affect breast milk volumes.   Discussed 2-week pump rental, parents have paperwork and aware of how to obtain DEBP--either call [place] or go by the [facility] BF store. Mom aware of OP/BFSG and [place] phone line assistance after D/C. Mom given "Baby and Me" booklet with review, referring to EBM storage guidelines and number of diapers to expect by day of life.    Maternal Data Has patient been taught Hand Expression?: Yes  Feeding Feeding Type: Breast Fed  LATCH Score/Interventions Latch: Grasps breast easily, tongue down, lips flanged, rhythmical sucking.  Audible Swallowing: Spontaneous and intermittent  Type of Nipple: Everted at rest and after stimulation  Comfort (Breast/Nipple): Soft / non-tender     Hold (Positioning): No assistance needed to correctly position infant at breast.  LATCH Score: 10  Lactation Tools Discussed/Used     Consult Status Consult Status:  PRN    [name] 12/11/2014, 10:03 AM

## 2014-12-12 ENCOUNTER — Encounter (HOSPITAL_COMMUNITY): Payer: Self-pay | Admitting: Obstetrics and Gynecology

## 2015-09-05 IMAGING — RF DG HYSTEROGRAM
3 series · 3 of 3 positions shown · IV contrast (omnipaque)
Comparison: None.

FLUOROSCOPY TIME:  0 min 18 seconds

CLINICAL DATA: Two previous miscarriages and right sided ectopic
pregnancy treated with methotrexate.

EXAM:
HYSTEROSALPINGOGRAM
TECHNIQUE: Following cleansing of the cervix and vagina with Betadine solution,
a hysterosalpingogram was performed using a 5-French
hysterosalpingogram catheter and Omnipaque 300 contrast. The patient
tolerated the examination, but briefly fainted near the end of the
study. The patient quickly recovered, but felt lightheaded. The
patient was monitored for approximately 30 min and had a normal
pulse rate of 78. The patient was subsequently discharged in the
care of her husband, without further complication.

[Series 1: run · 1 of 1 slices shown (1 of 3)]
[im 1/1]
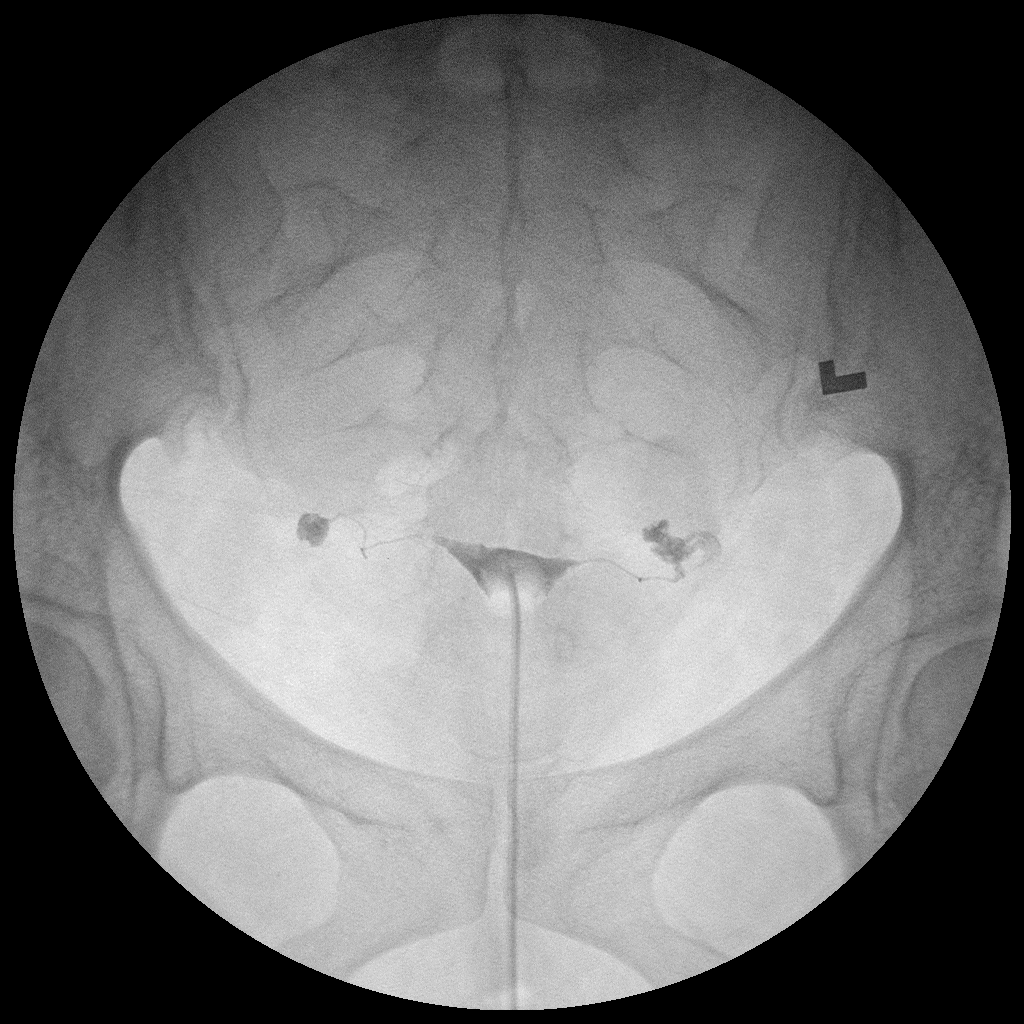

[Series 2: run · 1 of 1 slices shown (2 of 3)]
[im 1/1]
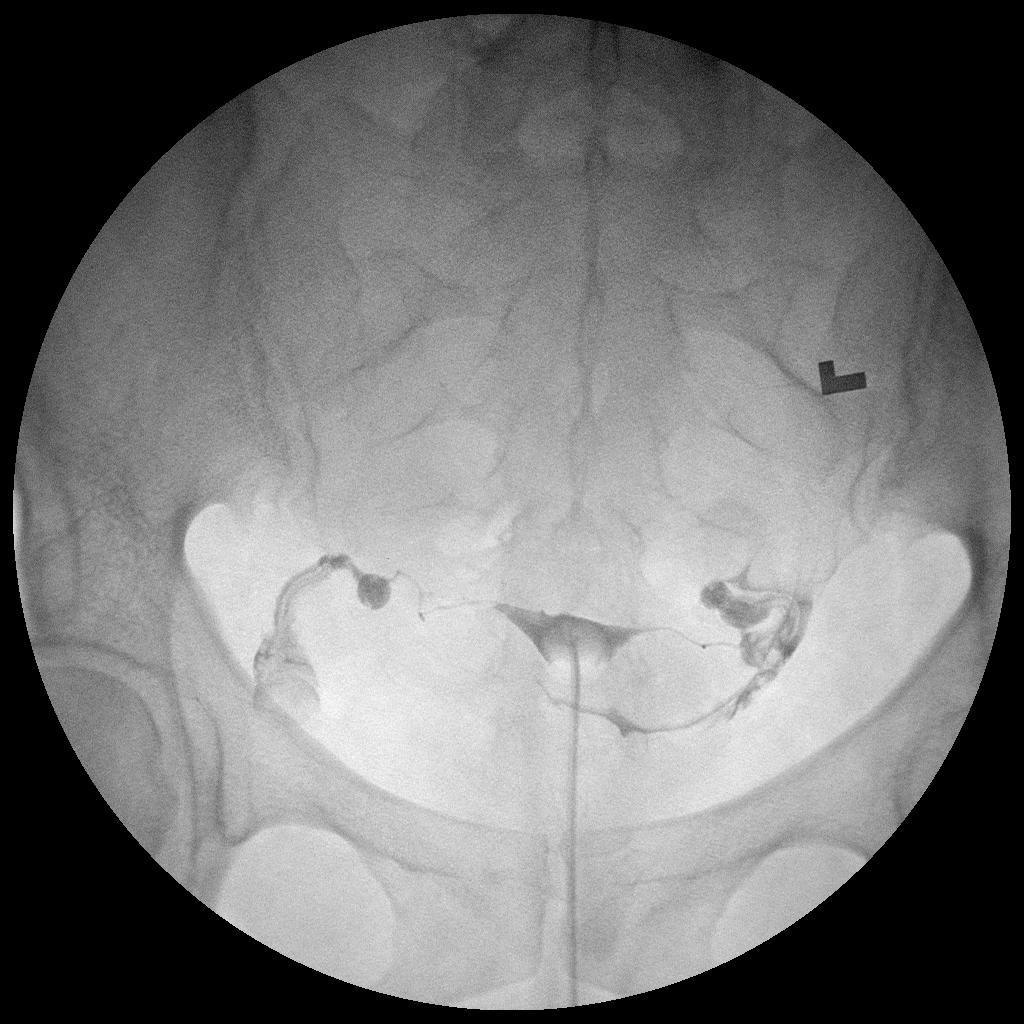

[Series 3: run · 1 of 1 slices shown (3 of 3)]
[im 1/1]
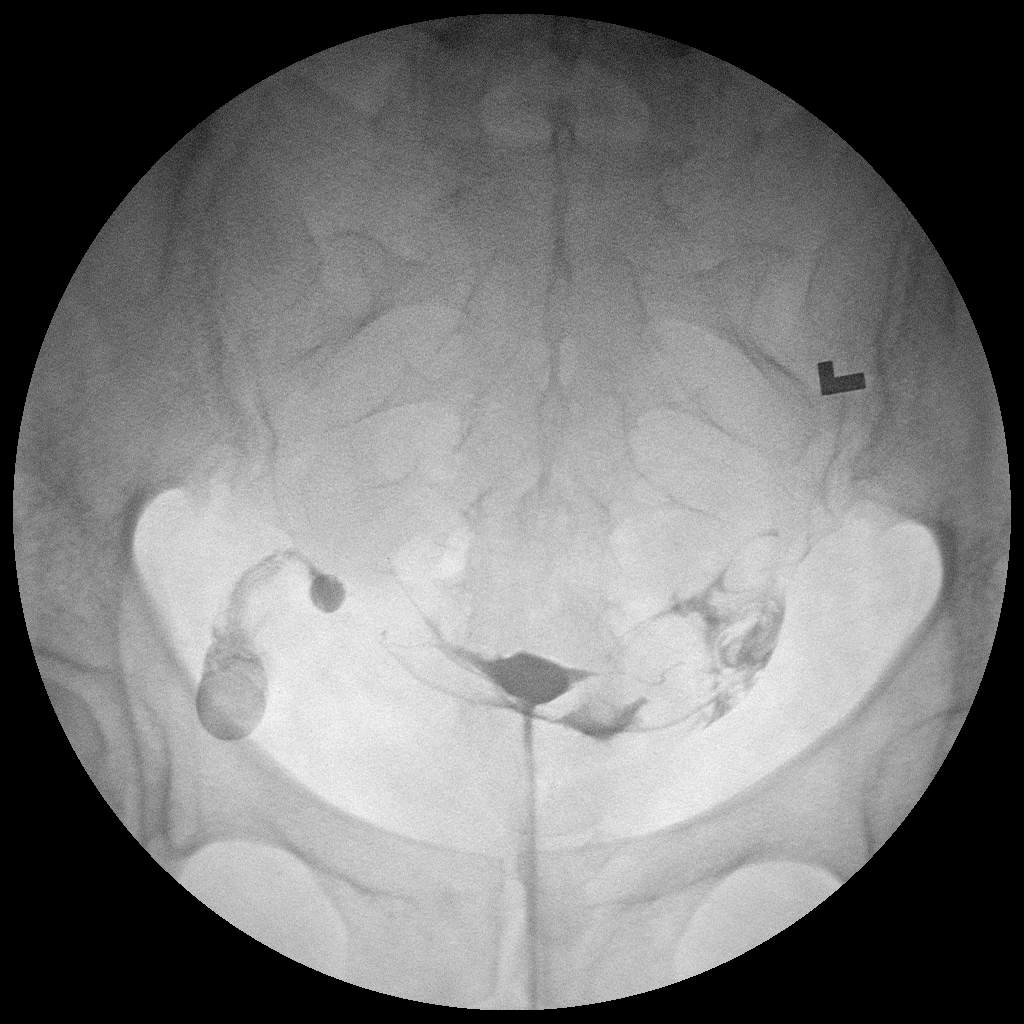

[3 of 3 positions shown; findings below may reference images not displayed]

FINDINGS: Endometrial Cavity: Normal appearance. No signs of Mullerian duct
anomaly or other significant abnormality.

Right Fallopian Tube: Partially opacified and mildly dilated. Distal
occlusion of right fallopian tube demonstrated, with no
intraperitoneal spill of contrast seen.

Left Fallopian Tube: Well opacified and normal in appearance. Free
intraperitoneal spill of contrast is demonstrated.

Other:  None.
IMPRESSION: Mild right hydrosalpinx with distal right fallopian tube occlusion.

Patent left fallopian tube.

Normal appearance of endometrial cavity.

Patient experienced a brief fainting episode near the conclusion of
the exam, however there was no further complication.

## 2016-04-20 DIAGNOSIS — J069 Acute upper respiratory infection, unspecified: Secondary | ICD-10-CM | POA: Diagnosis not present

## 2016-04-29 DIAGNOSIS — Z01419 Encounter for gynecological examination (general) (routine) without abnormal findings: Secondary | ICD-10-CM | POA: Diagnosis not present

## 2016-04-29 DIAGNOSIS — Z6841 Body Mass Index (BMI) 40.0 and over, adult: Secondary | ICD-10-CM | POA: Diagnosis not present

## 2016-05-02 DIAGNOSIS — Z130 Encounter for screening for diseases of the blood and blood-forming organs and certain disorders involving the immune mechanism: Secondary | ICD-10-CM | POA: Diagnosis not present

## 2016-05-02 DIAGNOSIS — Z1329 Encounter for screening for other suspected endocrine disorder: Secondary | ICD-10-CM | POA: Diagnosis not present

## 2016-05-02 DIAGNOSIS — Z0000 Encounter for general adult medical examination without abnormal findings: Secondary | ICD-10-CM | POA: Diagnosis not present

## 2016-05-02 DIAGNOSIS — Z13220 Encounter for screening for lipoid disorders: Secondary | ICD-10-CM | POA: Diagnosis not present

## 2016-05-02 DIAGNOSIS — Z1321 Encounter for screening for nutritional disorder: Secondary | ICD-10-CM | POA: Diagnosis not present

## 2016-06-10 DIAGNOSIS — R946 Abnormal results of thyroid function studies: Secondary | ICD-10-CM | POA: Diagnosis not present

## 2016-11-11 DIAGNOSIS — Z0000 Encounter for general adult medical examination without abnormal findings: Secondary | ICD-10-CM | POA: Diagnosis not present

## 2016-11-11 DIAGNOSIS — R633 Feeding difficulties: Secondary | ICD-10-CM | POA: Diagnosis not present

## 2016-11-11 DIAGNOSIS — E559 Vitamin D deficiency, unspecified: Secondary | ICD-10-CM | POA: Diagnosis not present

## 2016-11-11 DIAGNOSIS — Z13220 Encounter for screening for lipoid disorders: Secondary | ICD-10-CM | POA: Diagnosis not present

## 2016-11-11 DIAGNOSIS — E039 Hypothyroidism, unspecified: Secondary | ICD-10-CM | POA: Diagnosis not present

## 2016-11-11 DIAGNOSIS — E538 Deficiency of other specified B group vitamins: Secondary | ICD-10-CM | POA: Diagnosis not present

## 2017-07-15 DIAGNOSIS — Z1329 Encounter for screening for other suspected endocrine disorder: Secondary | ICD-10-CM | POA: Diagnosis not present

## 2017-07-15 DIAGNOSIS — E559 Vitamin D deficiency, unspecified: Secondary | ICD-10-CM | POA: Diagnosis not present

## 2017-07-15 DIAGNOSIS — Z6839 Body mass index (BMI) 39.0-39.9, adult: Secondary | ICD-10-CM | POA: Diagnosis not present

## 2017-07-15 DIAGNOSIS — Z01419 Encounter for gynecological examination (general) (routine) without abnormal findings: Secondary | ICD-10-CM | POA: Diagnosis not present

## 2017-07-15 DIAGNOSIS — Z1151 Encounter for screening for human papillomavirus (HPV): Secondary | ICD-10-CM | POA: Diagnosis not present

## 2017-08-26 DIAGNOSIS — E039 Hypothyroidism, unspecified: Secondary | ICD-10-CM | POA: Diagnosis not present

## 2017-12-14 DIAGNOSIS — Z6839 Body mass index (BMI) 39.0-39.9, adult: Secondary | ICD-10-CM | POA: Diagnosis not present

## 2017-12-14 DIAGNOSIS — Z23 Encounter for immunization: Secondary | ICD-10-CM | POA: Diagnosis not present

## 2017-12-14 DIAGNOSIS — E669 Obesity, unspecified: Secondary | ICD-10-CM | POA: Diagnosis not present

## 2017-12-31 DIAGNOSIS — N644 Mastodynia: Secondary | ICD-10-CM | POA: Diagnosis not present

## 2018-01-25 DIAGNOSIS — Z0000 Encounter for general adult medical examination without abnormal findings: Secondary | ICD-10-CM | POA: Diagnosis not present

## 2018-01-25 DIAGNOSIS — E538 Deficiency of other specified B group vitamins: Secondary | ICD-10-CM | POA: Diagnosis not present

## 2018-01-25 DIAGNOSIS — E559 Vitamin D deficiency, unspecified: Secondary | ICD-10-CM | POA: Diagnosis not present

## 2018-01-25 DIAGNOSIS — E039 Hypothyroidism, unspecified: Secondary | ICD-10-CM | POA: Diagnosis not present

## 2018-04-16 DIAGNOSIS — J0140 Acute pansinusitis, unspecified: Secondary | ICD-10-CM | POA: Diagnosis not present

## 2018-04-23 DIAGNOSIS — E039 Hypothyroidism, unspecified: Secondary | ICD-10-CM | POA: Diagnosis not present

## 2018-04-23 DIAGNOSIS — Z3169 Encounter for other general counseling and advice on procreation: Secondary | ICD-10-CM | POA: Diagnosis not present

## 2018-04-23 DIAGNOSIS — E559 Vitamin D deficiency, unspecified: Secondary | ICD-10-CM | POA: Diagnosis not present

## 2018-05-31 DIAGNOSIS — E039 Hypothyroidism, unspecified: Secondary | ICD-10-CM | POA: Diagnosis not present

## 2018-09-13 DIAGNOSIS — Z6841 Body Mass Index (BMI) 40.0 and over, adult: Secondary | ICD-10-CM | POA: Diagnosis not present

## 2018-09-13 DIAGNOSIS — Z01419 Encounter for gynecological examination (general) (routine) without abnormal findings: Secondary | ICD-10-CM | POA: Diagnosis not present

## 2019-01-03 DIAGNOSIS — Z23 Encounter for immunization: Secondary | ICD-10-CM | POA: Diagnosis not present

## 2019-01-25 DIAGNOSIS — M25571 Pain in right ankle and joints of right foot: Secondary | ICD-10-CM | POA: Diagnosis not present

## 2019-01-25 DIAGNOSIS — W010XXA Fall on same level from slipping, tripping and stumbling without subsequent striking against object, initial encounter: Secondary | ICD-10-CM | POA: Diagnosis not present

## 2019-01-25 DIAGNOSIS — E039 Hypothyroidism, unspecified: Secondary | ICD-10-CM | POA: Diagnosis not present

## 2019-01-25 DIAGNOSIS — E785 Hyperlipidemia, unspecified: Secondary | ICD-10-CM | POA: Diagnosis not present

## 2019-01-25 DIAGNOSIS — S92301A Fracture of unspecified metatarsal bone(s), right foot, initial encounter for closed fracture: Secondary | ICD-10-CM | POA: Diagnosis not present

## 2019-01-25 DIAGNOSIS — S92151A Displaced avulsion fracture (chip fracture) of right talus, initial encounter for closed fracture: Secondary | ICD-10-CM | POA: Diagnosis not present

## 2019-01-26 DIAGNOSIS — S92211D Displaced fracture of cuboid bone of right foot, subsequent encounter for fracture with routine healing: Secondary | ICD-10-CM | POA: Diagnosis not present

## 2019-10-04 ENCOUNTER — Ambulatory Visit: Payer: Self-pay
